# Patient Record
Sex: Female | Born: 1980 | Race: White | Hispanic: No | Marital: Married | Smoking: Never smoker
Health system: Southern US, Community
[De-identification: ages and names within clinical notes are randomized; demographics above are authoritative.]

## PROBLEM LIST (undated history)

## (undated) DIAGNOSIS — T8859XA Other complications of anesthesia, initial encounter: Secondary | ICD-10-CM

## (undated) DIAGNOSIS — K5903 Drug induced constipation: Secondary | ICD-10-CM

## (undated) DIAGNOSIS — E7800 Pure hypercholesterolemia, unspecified: Secondary | ICD-10-CM

## (undated) DIAGNOSIS — T4145XA Adverse effect of unspecified anesthetic, initial encounter: Secondary | ICD-10-CM

## (undated) DIAGNOSIS — F988 Other specified behavioral and emotional disorders with onset usually occurring in childhood and adolescence: Secondary | ICD-10-CM

## (undated) DIAGNOSIS — F419 Anxiety disorder, unspecified: Secondary | ICD-10-CM

## (undated) DIAGNOSIS — K219 Gastro-esophageal reflux disease without esophagitis: Secondary | ICD-10-CM

## (undated) HISTORY — DX: Other specified behavioral and emotional disorders with onset usually occurring in childhood and adolescence: F98.8

## (undated) HISTORY — DX: Pure hypercholesterolemia, unspecified: E78.00

## (undated) HISTORY — DX: Anxiety disorder, unspecified: F41.9

## (undated) HISTORY — DX: Gastro-esophageal reflux disease without esophagitis: K21.9

---
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
## 2004-02-10 HISTORY — PX: WISDOM TOOTH EXTRACTION: SHX21

## 2012-12-26 ENCOUNTER — Other Ambulatory Visit (HOSPITAL_COMMUNITY)
Admission: RE | Admit: 2012-12-26 | Discharge: 2012-12-26 | Disposition: A | Discharge status: Home / Self Care | Payer: 59 | Source: Ambulatory Visit | Attending: Physician Assistant | Admitting: Physician Assistant

## 2012-12-26 DIAGNOSIS — Z124 Encounter for screening for malignant neoplasm of cervix: Secondary | ICD-10-CM | POA: Insufficient documentation

## 2012-12-26 DIAGNOSIS — Z1151 Encounter for screening for human papillomavirus (HPV): Secondary | ICD-10-CM | POA: Insufficient documentation

## 2014-05-14 ENCOUNTER — Ambulatory Visit: Payer: BLUE CROSS/BLUE SHIELD | Attending: Physician Assistant

## 2014-05-14 DIAGNOSIS — M5442 Lumbago with sciatica, left side: Secondary | ICD-10-CM | POA: Diagnosis not present

## 2014-05-14 DIAGNOSIS — M25659 Stiffness of unspecified hip, not elsewhere classified: Secondary | ICD-10-CM

## 2014-05-14 NOTE — Patient Instructions (Signed)
Perform all exercises below:  Hold _20___ seconds. Repeat _3___ times.  Do __3__ sessions per day. CAUTION: Movement should be gentle, steady and slow.  Knee to Chest  Lying supine, bend involved knee to chest. Perform with each leg.  Copyright  VHI. All rights reserved.  Double Knee to Chest (Flexion)   Gently pull both knees toward chest. Feel stretch in lower back or buttock area. Breathing deeply, Lumbar Rotation: Caudal - Bilateral (Supine)  Feet and knees together, arms outstretched, rotate knees left, turning head in opposite direction, until stretch is felt.       

## 2014-05-14 NOTE — Therapy (Addendum)
[facility] [address] [place], [place], [address] Phone: [phone]   Fax:  [phone]  Physical Therapy Evaluation  Patient Details  Name: Katherine Atkinson MRN: [id number] Date of Birth: [birth year] Referring Provider:  Michel Harrow, PA-C  Encounter Date: 05/14/2014      PT End of Session - 05/14/14 1226    Visit Number 1   Date for PT Re-Evaluation 07/09/14   PT Start Time 1150   PT Stop Time 1237   PT Time Calculation (min) 47 min   Activity Tolerance Patient tolerated treatment well   Behavior During Therapy [facility] for tasks assessed/performed      No past medical history on file.  No past surgical history on file.  There were no vitals filed for this visit.  Visit Diagnosis:  Bilateral low back pain with left-sided sciatica - Plan: PT plan of care cert/re-cert  Hip stiffness, unspecified laterality - Plan: PT plan of care cert/re-cert      Subjective Assessment - 05/14/14 1153    Subjective Pt is a 34 y.o. who presents to PT with complaints of LBP and Lt LE pain.  Pt reports chronic history of LBP and onset of Lt LE pain in 2015.     Diagnostic tests none.  Pt has referral for x-ray   Patient Stated Goals reduce back and leg pain   Currently in Pain? Yes   Pain Score 5    Pain Location Back   Pain Orientation Right;Left   Pain Descriptors / Indicators Tightness;Shooting;Aching   Pain Radiating Towards Lt leg-posterior aspect to the foot.  Pt describes it as aching.   Pain Onset More than a month ago   Pain Frequency Intermittent   Aggravating Factors  rolling over in bed, getting up from a chair, sitting too long.  Not able to bend over.   Pain Relieving Factors Not moving, sitting down, laying down, stopping the aggravating activity   Multiple Pain Sites No            [facility] PT Assessment - 05/14/14 0001    Assessment   Medical Diagnosis low back pain (M54.5)   Onset Date 06/12/13   Next MD Visit none scheduled   Prior Therapy none   Precautions   Precautions None   Restrictions   Weight Bearing Restrictions Yes   Balance Screen   Has the patient fallen in the past 6 months No   Has the patient had a decrease in activity level because of a fear of falling?  No   Is the patient reluctant to leave their home because of a fear of falling?  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     AROM Assessment Site Lumbar   PROM   Overall PROM  Deficits  Overall PROM Comments hip flexion and IR limited by 25%.  No pain reported   PROM Assessment Site Hip   Strength   Overall Strength Within functional limits for tasks performed   Overall Strength Comments 5/5 bilateral LE strength   Strength Assessment Site Hip;Knee   Palpation   Palpation Pt with muscle tension over bilateral lumbar paraspinals, SI joint and deep gluteals.     Special Tests    Special Tests Lumbar   Slump test   Findings Positive   Side Left   Straight Leg Raise   Findings Negative   Side  Left   Bed Mobility   Bed Mobility Rolling Right;Rolling Left   Rolling Right 6: Modified independent (Device/Increase time)   Rolling Left 6: Modified independent (Device/Increase time)                   [facility] Adult PT Treatment/Exercise - 05/14/14 0001    Exercises    Exercises Lumbar;Knee/Hip   Lumbar Exercises: Stretches   Single Knee to Chest Stretch 3 reps;20 seconds   Double Knee to Chest Stretch 3 reps;20 seconds   Lower Trunk Rotation 3 reps;20 seconds   Modalities   Modalities Moist Heat   Moist Heat Therapy   Number Minutes Moist Heat 15 Minutes   Moist Heat Location Other (comment)  lumbar                PT Education - 05/14/14 1222    Education provided Yes   Education Details HEP: low trunk rotation, knee and double knee to chest   Person(s) Educated Patient   Methods Explanation;Demonstration;Handout   Comprehension Verbalized understanding;Returned demonstration          PT Short Term Goals - 05/14/14 1223    PT SHORT TERM GOAL #1   Title be independent in initial HEP   Time 4   Period Weeks   Status New   PT SHORT TERM GOAL #2   Title report a 30% reduction in LBP and Lt LE pain with ADLs and houswork   Time 4   Period Weeks   Status New           PT Long Term Goals - 05/14/14 1151    PT LONG TERM GOAL #1   Title be independent in an advanced HEP   Time 8   Period Weeks   Status New   PT LONG TERM GOAL #2   Title reduce FOTO to < or = to 36% limitation   Time 8   Period Weeks   Status New   PT LONG TERM GOAL #3   Title report a 70% reduction in LBP and Lt LE pain with ADLs and housework   Time 8   Period Weeks   Status New   PT LONG TERM GOAL #4   Title roll in bed with < or = to 3/10 LBP   Time 8   Period Weeks   Status New               Plan - 05/14/14 1227    Pt will benefit from skilled therapeutic intervention in order to improve on the following deficits Impaired flexibility;Improper body mechanics;Pain   Rehab Potential Good   PT Frequency 2x / week   PT Duration 8 weeks   PT Treatment/Interventions ADLs/Self Care Home Management;Moist Heat;Therapeutic activities;Therapeutic exercise;Passive range of motion;Ultrasound;Manual techniques;Neuromuscular re-education;Electrical  Stimulation   PT Next Visit Plan Hip and lumbar flexibility, body mechanics education  Consulted and Agree with Plan of Care Patient         Problem List There are no active problems to display for this patient.   TAKACS,KELLY, PT 05/14/2014, 12:34 PM PHYSICAL THERAPY DISCHARGE SUMMARY  Visits from Start of Care: 1  Current functional level related to goals / functional outcomes: Pt attended 1 PT session for evaluation and didn't return.     Remaining deficits: Unknown as pt didn't return to PT .  Thank you for this referral.     Education / Equipment: HEP Plan: Patient agrees to discharge.  Patient goals were not met. Patient is being discharged due to the patient's request.  ?????   Sigurd Sos, PT 07/02/2014 9:26 AM  [facility] [address], [place], [place], [address] Phone: [phone]   Fax:  [phone]

## 2014-09-05 ENCOUNTER — Other Ambulatory Visit: Payer: Self-pay | Admitting: Family Medicine

## 2014-09-05 DIAGNOSIS — M545 Low back pain: Secondary | ICD-10-CM

## 2014-09-13 ENCOUNTER — Ambulatory Visit
Admission: RE | Admit: 2014-09-13 | Discharge: 2014-09-13 | Disposition: A | Discharge status: Home / Self Care | Payer: BLUE CROSS/BLUE SHIELD | Source: Ambulatory Visit | Attending: Family Medicine | Admitting: Family Medicine

## 2014-09-13 DIAGNOSIS — M545 Low back pain: Secondary | ICD-10-CM

## 2014-11-10 HISTORY — PX: BACK SURGERY: SHX140

## 2015-01-02 ENCOUNTER — Other Ambulatory Visit: Payer: Self-pay | Admitting: Neurological Surgery

## 2015-01-25 ENCOUNTER — Encounter (HOSPITAL_COMMUNITY)
Admission: RE | Admit: 2015-01-25 | Discharge: 2015-01-25 | Disposition: A | Discharge status: Home / Self Care | Payer: BLUE CROSS/BLUE SHIELD | Source: Ambulatory Visit | Attending: Neurological Surgery | Admitting: Neurological Surgery

## 2015-01-25 ENCOUNTER — Encounter (HOSPITAL_COMMUNITY): Payer: Self-pay

## 2015-01-25 ENCOUNTER — Other Ambulatory Visit (HOSPITAL_COMMUNITY): Payer: Self-pay | Admitting: Neurological Surgery

## 2015-01-25 DIAGNOSIS — M5126 Other intervertebral disc displacement, lumbar region: Secondary | ICD-10-CM | POA: Diagnosis not present

## 2015-01-25 DIAGNOSIS — Z01812 Encounter for preprocedural laboratory examination: Secondary | ICD-10-CM | POA: Insufficient documentation

## 2015-01-25 DIAGNOSIS — Z0183 Encounter for blood typing: Secondary | ICD-10-CM | POA: Diagnosis not present

## 2015-01-25 HISTORY — DX: Other complications of anesthesia, initial encounter: T88.59XA

## 2015-01-25 HISTORY — DX: Adverse effect of unspecified anesthetic, initial encounter: T41.45XA

## 2015-01-25 HISTORY — DX: Drug induced constipation: K59.03

## 2015-01-25 LAB — TYPE AND SCREEN
ABO/RH(D): O NEG
Antibody Screen: NEGATIVE

## 2015-01-25 LAB — CBC
HCT: 41.8 % (ref 36.0–46.0)
Hemoglobin: 14.1 g/dL (ref 12.0–15.0)
MCH: 30.1 pg (ref 26.0–34.0)
MCHC: 33.7 g/dL (ref 30.0–36.0)
MCV: 89.3 fL (ref 78.0–100.0)
PLATELETS: 334 10*3/uL (ref 150–400)
RBC: 4.68 MIL/uL (ref 3.87–5.11)
RDW: 13.2 % (ref 11.5–15.5)
WBC: 9.4 10*3/uL (ref 4.0–10.5)

## 2015-01-25 LAB — HCG, SERUM, QUALITATIVE: Preg, Serum: NEGATIVE

## 2015-01-25 LAB — SURGICAL PCR SCREEN
MRSA, PCR: NEGATIVE
Staphylococcus aureus: POSITIVE — AB

## 2015-01-25 LAB — ABO/RH: ABO/RH(D): O NEG

## 2015-01-25 NOTE — Pre-Procedure Instructions (Signed)
Katherine Atkinson  01/25/2015      [id number]- [place], [place] - [address] [place] [id number] Phone: [phone] Fax: [phone]    Your procedure is scheduled on 01/31/15.  [facility] at 700 A.M.  Call this number if you have problems the morning of surgery:  [phone]   Remember:  Do not eat food or drink liquids after midnight.  Take these medicines the morning of surgery with A SIP OF WATER pain med if needed(tylenol)     STOP all herbel meds, nsaids (aleve,naproxen,advil,ibuprofen) 5 days prior to surgery including vitamins,aspirin   Do not wear jewelry, make-up or nail polish.  Do not wear lotions, powders, or perfumes.  You may wear deodorant.  Do not shave 48 hours prior to surgery.  Men may shave face and neck.  Do not bring valuables to the hospital.  [facility] is not responsible for any belongings or valuables.  Contacts, dentures or bridgework may not be worn into surgery.  Leave your suitcase in the car.  After surgery it may be brought to your room.  For patients admitted to the hospital, discharge time will be determined by your treatment team.  Patients discharged the day of surgery will not be allowed to drive home.   Name and phone number of your driver:    Special instructions:  Special Instructions: [facility] - Preparing for Surgery  Before surgery, you can play an important role.  Because skin is not sterile, your skin needs to be as free of germs as possible.  You can reduce the number of germs on you skin by washing with CHG (chlorahexidine gluconate) soap before surgery.  CHG is an antiseptic cleaner which kills germs and bonds with the skin to continue killing germs even after washing.  Please DO NOT use if you have an allergy to CHG or antibacterial soaps.  If your skin becomes reddened/irritated stop using the CHG and inform your nurse when you arrive at Short Stay.  Do not  shave (including legs and underarms) for at least 48 hours prior to the first CHG shower.  You may shave your face.  Please follow these instructions carefully:   1.  Shower with CHG Soap the night before surgery and the morning of Surgery.  2.  If you choose to wash your hair, wash your hair first as usual with your normal shampoo.  3.  After you shampoo, rinse your hair and body thoroughly to remove the Shampoo.  4.  Use CHG as you would any other liquid soap.  You can apply chg directly  to the skin and wash gently with scrungie or a clean washcloth.  5.  Apply the CHG Soap to your body ONLY FROM THE NECK DOWN.  Do not use on open wounds or open sores.  Avoid contact with your eyes ears, mouth and genitals (private parts).  Wash genitals (private parts)       with your normal soap.  6.  Wash thoroughly, paying special attention to the area where your surgery will be performed.  7.  Thoroughly rinse your body with warm water from the neck down.  8.  DO NOT shower/wash with your normal soap after using and rinsing off the CHG Soap.  9.  Pat yourself dry with a clean towel.            10.  Wear clean pajamas.  11.  Place clean sheets on your bed the night of your first shower and do not sleep with pets.  Day of Surgery  Do not apply any lotions/deodorants the morning of surgery.  Please wear clean clothes to the hospital/surgery center.  Please read over the following fact sheets that you were given. Pain Booklet, Coughing and Deep Breathing, Blood Transfusion Information, MRSA Information and Surgical Site Infection Prevention

## 2015-01-25 NOTE — Pre-Procedure Instructions (Signed)
   Katherine Atkinson  01/25/2015     Your procedure is scheduled on : Thursday January 31, 2015 at 9:00 AM.  [facility] at 7:00 AM.  Call this number if you have problems the morning of surgery: [phone]    Remember:  Do not eat food or drink liquids after midnight.  Take these medicines the morning of surgery with A SIP OF WATER: Oxycodone (Percocet) if needed, Diazepam (Valium) if needed     STOP any vitamins, herbal meds, NSAIDS (aleve,naproxen,advil,ibuprofen), etc   Do not wear jewelry, make-up or nail polish.  Do not wear lotions, powders, or perfumes.    Do not shave 48 hours prior to surgery.    Do not bring valuables to the hospital.  [facility] is not responsible for any belongings or valuables.  Contacts, dentures or bridgework may not be worn into surgery.  Leave your suitcase in the car.  After surgery it may be brought to your room.  For patients admitted to the hospital, discharge time will be determined by your treatment team.  Patients discharged the day of surgery will not be allowed to drive home.   Name and phone number of your driver:    Special instructions: Shower using CHG soap the night before and the morning of your surgery  Please read over the following fact sheets that you were given. Pain Booklet, Coughing and Deep Breathing, Blood Transfusion Information, MRSA Information and Surgical Site Infection Prevention

## 2015-01-30 MED ORDER — CEFAZOLIN SODIUM-DEXTROSE 2-3 GM-% IV SOLR
2.0000 g | INTRAVENOUS | Status: AC
  Administered 2015-01-31: 2 g via INTRAVENOUS
  Filled 2015-01-30: qty 50

## 2015-01-31 ENCOUNTER — Encounter (HOSPITAL_COMMUNITY): Payer: Self-pay | Admitting: *Deleted

## 2015-01-31 ENCOUNTER — Inpatient Hospital Stay (HOSPITAL_COMMUNITY): Payer: BLUE CROSS/BLUE SHIELD | Admitting: Anesthesiology

## 2015-01-31 ENCOUNTER — Inpatient Hospital Stay (HOSPITAL_COMMUNITY): Payer: BLUE CROSS/BLUE SHIELD

## 2015-01-31 ENCOUNTER — Inpatient Hospital Stay (HOSPITAL_COMMUNITY)
Admission: RE | Admit: 2015-01-31 | Discharge: 2015-02-01 | DRG: 857 | Disposition: A | Discharge status: Home Health | Payer: BLUE CROSS/BLUE SHIELD | Source: Ambulatory Visit | Attending: Neurological Surgery | Admitting: Neurological Surgery

## 2015-01-31 ENCOUNTER — Encounter (HOSPITAL_COMMUNITY)
Admission: RE | Disposition: A | Discharge status: Home Health | Payer: Self-pay | Source: Ambulatory Visit | Attending: Neurological Surgery

## 2015-01-31 DIAGNOSIS — T814XXA Infection following a procedure, initial encounter: Principal | ICD-10-CM | POA: Diagnosis present

## 2015-01-31 DIAGNOSIS — L02212 Cutaneous abscess of back [any part, except buttock]: Secondary | ICD-10-CM | POA: Diagnosis present

## 2015-01-31 DIAGNOSIS — M5126 Other intervertebral disc displacement, lumbar region: Secondary | ICD-10-CM | POA: Diagnosis present

## 2015-01-31 DIAGNOSIS — Y838 Other surgical procedures as the cause of abnormal reaction of the patient, or of later complication, without mention of misadventure at the time of the procedure: Secondary | ICD-10-CM | POA: Diagnosis present

## 2015-01-31 DIAGNOSIS — M7989 Other specified soft tissue disorders: Secondary | ICD-10-CM | POA: Diagnosis present

## 2015-01-31 DIAGNOSIS — IMO0002 Reserved for concepts with insufficient information to code with codable children: Secondary | ICD-10-CM | POA: Diagnosis present

## 2015-01-31 DIAGNOSIS — M549 Dorsalgia, unspecified: Secondary | ICD-10-CM

## 2015-01-31 HISTORY — PX: TRANSFORAMINAL LUMBAR INTERBODY FUSION (TLIF) WITH PEDICLE SCREW FIXATION 1 LEVEL: SHX6141

## 2015-01-31 HISTORY — PX: LUMBAR WOUND DEBRIDEMENT: SHX1988

## 2015-01-31 LAB — SEDIMENTATION RATE: Sed Rate: 13 mm/hr (ref 0–22)

## 2015-01-31 LAB — C-REACTIVE PROTEIN: CRP: <0.5 mg/dL (ref <1.0)

## 2015-01-31 SURGERY — TRANSFORAMINAL LUMBAR INTERBODY FUSION (TLIF) WITH PEDICLE SCREW FIXATION 1 LEVEL
Anesthesia: General | Site: Back

## 2015-01-31 MED ORDER — SENNA 8.6 MG PO TABS
1.0000 | ORAL_TABLET | Freq: Two times a day (BID) | ORAL | Status: DC
  Administered 2015-01-31 – 2015-02-01 (×3): 8.6 mg via ORAL
  Filled 2015-01-31 (×3): qty 1

## 2015-01-31 MED ORDER — SUCCINYLCHOLINE CHLORIDE 20 MG/ML IJ SOLN
INTRAMUSCULAR | Status: DC | PRN
  Administered 2015-01-31: 80 mg via INTRAVENOUS

## 2015-01-31 MED ORDER — FENTANYL CITRATE (PF) 100 MCG/2ML IJ SOLN
INTRAMUSCULAR | Status: DC | PRN
  Administered 2015-01-31: 250 ug via INTRAVENOUS
  Administered 2015-01-31: 100 ug via INTRAVENOUS

## 2015-01-31 MED ORDER — FENTANYL CITRATE (PF) 250 MCG/5ML IJ SOLN
INTRAMUSCULAR | Status: AC
  Filled 2015-01-31: qty 5

## 2015-01-31 MED ORDER — SODIUM CHLORIDE 0.9 % IJ SOLN: 3.0000 mL | INTRAMUSCULAR | Status: DC | PRN

## 2015-01-31 MED ORDER — MIDAZOLAM HCL 2 MG/2ML IJ SOLN
INTRAMUSCULAR | Status: AC
  Filled 2015-01-31: qty 2

## 2015-01-31 MED ORDER — DEXTROSE 5 % IV SOLN
2.0000 g | INTRAVENOUS | Status: DC
  Administered 2015-01-31 – 2015-02-01 (×2): 2 g via INTRAVENOUS
  Filled 2015-01-31 (×2): qty 2

## 2015-01-31 MED ORDER — HYDROMORPHONE HCL 1 MG/ML IJ SOLN
0.2500 mg | INTRAMUSCULAR | Status: DC | PRN
  Administered 2015-01-31 (×2): 0.5 mg via INTRAVENOUS

## 2015-01-31 MED ORDER — PROPOFOL 1000 MG/100ML IV EMUL
INTRAVENOUS | Status: AC
  Administered 2015-01-31: 50 ug/kg/min via INTRAVENOUS
  Filled 2015-01-31: qty 100

## 2015-01-31 MED ORDER — OXYCODONE-ACETAMINOPHEN 5-325 MG PO TABS
1.0000 | ORAL_TABLET | ORAL | Status: DC | PRN
  Administered 2015-01-31 (×3): 2 via ORAL
  Administered 2015-02-01: 1 via ORAL
  Administered 2015-02-01: 2 via ORAL
  Filled 2015-01-31 (×4): qty 2

## 2015-01-31 MED ORDER — HYDROMORPHONE HCL 1 MG/ML IJ SOLN: 0.2500 mg | INTRAMUSCULAR | Status: DC | PRN

## 2015-01-31 MED ORDER — ONDANSETRON HCL 4 MG/2ML IJ SOLN
INTRAMUSCULAR | Status: AC
  Filled 2015-01-31: qty 2

## 2015-01-31 MED ORDER — METHOCARBAMOL 500 MG PO TABS
ORAL_TABLET | ORAL | Status: AC
  Filled 2015-01-31: qty 1

## 2015-01-31 MED ORDER — ONDANSETRON HCL 4 MG/2ML IJ SOLN: 4.0000 mg | INTRAMUSCULAR | Status: DC | PRN

## 2015-01-31 MED ORDER — OXYCODONE HCL 5 MG/5ML PO SOLN: 5.0000 mg | Freq: Once | ORAL | Status: DC | PRN

## 2015-01-31 MED ORDER — METHOCARBAMOL 500 MG PO TABS
500.0000 mg | ORAL_TABLET | Freq: Four times a day (QID) | ORAL | Status: DC | PRN
  Administered 2015-01-31: 500 mg via ORAL

## 2015-01-31 MED ORDER — SODIUM CHLORIDE 0.9 % IJ SOLN
3.0000 mL | Freq: Two times a day (BID) | INTRAMUSCULAR | Status: DC
  Administered 2015-01-31: 3 mL via INTRAVENOUS

## 2015-01-31 MED ORDER — PHENYLEPHRINE HCL 10 MG/ML IJ SOLN
10.0000 mg | INTRAVENOUS | Status: DC | PRN
  Administered 2015-01-31: 50 ug/min via INTRAVENOUS

## 2015-01-31 MED ORDER — MIDAZOLAM HCL 5 MG/5ML IJ SOLN
INTRAMUSCULAR | Status: DC | PRN
  Administered 2015-01-31 (×2): 2 mg via INTRAVENOUS

## 2015-01-31 MED ORDER — DIAZEPAM 5 MG/ML IJ SOLN
2.5000 mg | Freq: Once | INTRAMUSCULAR | Status: AC
  Administered 2015-01-31: 2.5 mg via INTRAVENOUS

## 2015-01-31 MED ORDER — PROPOFOL 10 MG/ML IV BOLUS
INTRAVENOUS | Status: DC | PRN
  Administered 2015-01-31: 200 mg via INTRAVENOUS
  Administered 2015-01-31: 100 mg via INTRAVENOUS

## 2015-01-31 MED ORDER — SODIUM CHLORIDE 0.9 % IV SOLN: 250.0000 mL | INTRAVENOUS | Status: DC

## 2015-01-31 MED ORDER — THROMBIN 5000 UNITS EX SOLR
OROMUCOSAL | Status: DC | PRN
  Administered 2015-01-31: 09:00:00 via TOPICAL

## 2015-01-31 MED ORDER — BUPIVACAINE HCL (PF) 0.25 % IJ SOLN
INTRAMUSCULAR | Status: DC | PRN
  Administered 2015-01-31: 8 mL

## 2015-01-31 MED ORDER — HYDROMORPHONE HCL 1 MG/ML IJ SOLN
INTRAMUSCULAR | Status: AC
  Filled 2015-01-31: qty 1

## 2015-01-31 MED ORDER — GADOBENATE DIMEGLUMINE 529 MG/ML IV SOLN
18.0000 mL | Freq: Once | INTRAVENOUS | Status: AC | PRN
  Administered 2015-01-31: 18 mL via INTRAVENOUS

## 2015-01-31 MED ORDER — ROCURONIUM BROMIDE 50 MG/5ML IV SOLN
INTRAVENOUS | Status: AC
  Filled 2015-01-31: qty 1

## 2015-01-31 MED ORDER — ACETAMINOPHEN 160 MG/5ML PO SOLN
325.0000 mg | ORAL | Status: DC | PRN
  Filled 2015-01-31: qty 20.3

## 2015-01-31 MED ORDER — DIAZEPAM 5 MG/ML IJ SOLN
INTRAMUSCULAR | Status: AC
  Administered 2015-01-31: 17:00:00
  Filled 2015-01-31: qty 2

## 2015-01-31 MED ORDER — ACETAMINOPHEN 10 MG/ML IV SOLN
INTRAVENOUS | Status: AC
  Administered 2015-01-31: 1000 mg via INTRAVENOUS
  Filled 2015-01-31: qty 100

## 2015-01-31 MED ORDER — 0.9 % SODIUM CHLORIDE (POUR BTL) OPTIME
TOPICAL | Status: DC | PRN
  Administered 2015-01-31: 1000 mL

## 2015-01-31 MED ORDER — ACETAMINOPHEN 650 MG RE SUPP: 650.0000 mg | RECTAL | Status: DC | PRN

## 2015-01-31 MED ORDER — MENTHOL 3 MG MT LOZG: 1.0000 | LOZENGE | OROMUCOSAL | Status: DC | PRN

## 2015-01-31 MED ORDER — DEXAMETHASONE SODIUM PHOSPHATE 4 MG/ML IJ SOLN
INTRAMUSCULAR | Status: DC | PRN
  Administered 2015-01-31: 4 mg via INTRAVENOUS

## 2015-01-31 MED ORDER — ALBUMIN HUMAN 5 % IV SOLN
INTRAVENOUS | Status: DC | PRN
  Administered 2015-01-31 (×2): via INTRAVENOUS

## 2015-01-31 MED ORDER — ACETAMINOPHEN 325 MG PO TABS: 325.0000 mg | ORAL_TABLET | ORAL | Status: DC | PRN

## 2015-01-31 MED ORDER — OXYCODONE-ACETAMINOPHEN 5-325 MG PO TABS
ORAL_TABLET | ORAL | Status: AC
  Filled 2015-01-31: qty 2

## 2015-01-31 MED ORDER — OXYCODONE HCL 5 MG PO TABS: 5.0000 mg | ORAL_TABLET | Freq: Once | ORAL | Status: DC | PRN

## 2015-01-31 MED ORDER — ARTIFICIAL TEARS OP OINT
TOPICAL_OINTMENT | OPHTHALMIC | Status: AC
  Filled 2015-01-31: qty 3.5

## 2015-01-31 MED ORDER — VANCOMYCIN HCL 1000 MG IV SOLR
INTRAVENOUS | Status: DC | PRN
  Administered 2015-01-31: 1000 mg

## 2015-01-31 MED ORDER — POTASSIUM CHLORIDE IN NACL 20-0.9 MEQ/L-% IV SOLN
INTRAVENOUS | Status: DC
  Administered 2015-01-31 – 2015-02-01 (×3): via INTRAVENOUS
  Filled 2015-01-31 (×4): qty 1000

## 2015-01-31 MED ORDER — SODIUM CHLORIDE 0.9 % IR SOLN
Status: DC | PRN
  Administered 2015-01-31: 08:00:00

## 2015-01-31 MED ORDER — VANCOMYCIN HCL 10 G IV SOLR
1250.0000 mg | Freq: Two times a day (BID) | INTRAVENOUS | Status: DC
  Administered 2015-02-01: 1250 mg via INTRAVENOUS
  Filled 2015-01-31 (×3): qty 1250

## 2015-01-31 MED ORDER — PROPOFOL 10 MG/ML IV BOLUS
INTRAVENOUS | Status: AC
  Filled 2015-01-31: qty 40

## 2015-01-31 MED ORDER — FENTANYL CITRATE (PF) 250 MCG/5ML IJ SOLN
INTRAMUSCULAR | Status: AC
Start: 2015-01-31 — End: 2015-01-31
  Filled 2015-01-31: qty 5

## 2015-01-31 MED ORDER — THROMBIN 20000 UNITS EX SOLR
CUTANEOUS | Status: DC | PRN
  Administered 2015-01-31: 09:00:00 via TOPICAL

## 2015-01-31 MED ORDER — VANCOMYCIN HCL 1000 MG IV SOLR
INTRAVENOUS | Status: AC
  Filled 2015-01-31: qty 1000

## 2015-01-31 MED ORDER — METHOCARBAMOL 1000 MG/10ML IJ SOLN: 500.0000 mg | Freq: Four times a day (QID) | INTRAMUSCULAR | Status: DC | PRN

## 2015-01-31 MED ORDER — MORPHINE SULFATE (PF) 2 MG/ML IV SOLN: 1.0000 mg | INTRAVENOUS | Status: DC | PRN

## 2015-01-31 MED ORDER — ONDANSETRON HCL 4 MG/2ML IJ SOLN
INTRAMUSCULAR | Status: DC | PRN
  Administered 2015-01-31: 4 mg via INTRAVENOUS

## 2015-01-31 MED ORDER — LACTATED RINGERS IV SOLN
INTRAVENOUS | Status: DC
  Administered 2015-01-31: 07:00:00 via INTRAVENOUS

## 2015-01-31 MED ORDER — PHENOL 1.4 % MT LIQD: 1.0000 | OROMUCOSAL | Status: DC | PRN

## 2015-01-31 MED ORDER — ACETAMINOPHEN 325 MG PO TABS: 650.0000 mg | ORAL_TABLET | ORAL | Status: DC | PRN

## 2015-01-31 MED ORDER — VANCOMYCIN HCL 10 G IV SOLR
1250.0000 mg | Freq: Two times a day (BID) | INTRAVENOUS | Status: DC
  Administered 2015-01-31: 1250 mg via INTRAVENOUS
  Filled 2015-01-31 (×2): qty 1250

## 2015-01-31 MED ORDER — LIDOCAINE HCL (CARDIAC) 20 MG/ML IV SOLN
INTRAVENOUS | Status: DC | PRN
  Administered 2015-01-31: 80 mg via INTRAVENOUS

## 2015-01-31 SURGICAL SUPPLY — 48 items
BAG DECANTER FOR FLEXI CONT (MISCELLANEOUS) ×3 IMPLANT
BENZOIN TINCTURE PRP APPL 2/3 (GAUZE/BANDAGES/DRESSINGS) ×3 IMPLANT
BLADE CLIPPER SURG (BLADE) IMPLANT
BUR MATCHSTICK NEURO 3.0 LAGG (BURR) ×3 IMPLANT
CANISTER SUCT 3000ML PPV (MISCELLANEOUS) ×3 IMPLANT
CLIP NEUROVISION LG (CLIP) ×3 IMPLANT
CONT SPEC 4OZ CLIKSEAL STRL BL (MISCELLANEOUS) ×3 IMPLANT
COVER BACK TABLE 60X90IN (DRAPES) ×3 IMPLANT
DRAPE C-ARM 42X72 X-RAY (DRAPES) ×6 IMPLANT
DRAPE LAPAROTOMY 100X72X124 (DRAPES) ×3 IMPLANT
DRAPE POUCH INSTRU U-SHP 10X18 (DRAPES) ×3 IMPLANT
DRAPE SURG 17X23 STRL (DRAPES) ×3 IMPLANT
DRSG OPSITE POSTOP 4X6 (GAUZE/BANDAGES/DRESSINGS) ×3 IMPLANT
DURAPREP 26ML APPLICATOR (WOUND CARE) ×3 IMPLANT
ELECT REM PT RETURN 9FT ADLT (ELECTROSURGICAL) ×3
ELECTRODE REM PT RTRN 9FT ADLT (ELECTROSURGICAL) ×2 IMPLANT
EVACUATOR 1/8 PVC DRAIN (DRAIN) IMPLANT
GAUZE SPONGE 4X4 16PLY XRAY LF (GAUZE/BANDAGES/DRESSINGS) IMPLANT
GLOVE BIO SURGEON STRL SZ8 (GLOVE) ×6 IMPLANT
GLOVE INDICATOR 7.5 STRL GRN (GLOVE) ×6 IMPLANT
GOWN STRL REUS W/ TWL LRG LVL3 (GOWN DISPOSABLE) IMPLANT
GOWN STRL REUS W/ TWL XL LVL3 (GOWN DISPOSABLE) ×4 IMPLANT
GOWN STRL REUS W/TWL 2XL LVL3 (GOWN DISPOSABLE) IMPLANT
GOWN STRL REUS W/TWL LRG LVL3 (GOWN DISPOSABLE)
GOWN STRL REUS W/TWL XL LVL3 (GOWN DISPOSABLE) ×2
HEMOSTAT POWDER KIT SURGIFOAM (HEMOSTASIS) ×3 IMPLANT
KIT BASIN OR (CUSTOM PROCEDURE TRAY) ×3 IMPLANT
KIT ROOM TURNOVER OR (KITS) ×3 IMPLANT
MILL MEDIUM DISP (BLADE) IMPLANT
MODULE NVM5 NEXT GEN EMG (NEEDLE) ×3 IMPLANT
NEEDLE HYPO 25X1 1.5 SAFETY (NEEDLE) ×3 IMPLANT
NS IRRIG 1000ML POUR BTL (IV SOLUTION) ×3 IMPLANT
PACK LAMINECTOMY NEURO (CUSTOM PROCEDURE TRAY) ×3 IMPLANT
PAD ARMBOARD 7.5X6 YLW CONV (MISCELLANEOUS) ×9 IMPLANT
SPONGE LAP 4X18 X RAY DECT (DISPOSABLE) IMPLANT
SPONGE SURGIFOAM ABS GEL 100 (HEMOSTASIS) ×3 IMPLANT
STRIP CLOSURE SKIN 1/2X4 (GAUZE/BANDAGES/DRESSINGS) ×3 IMPLANT
SUT VIC AB 0 CT1 18XCR BRD8 (SUTURE) ×2 IMPLANT
SUT VIC AB 0 CT1 8-18 (SUTURE) ×1
SUT VIC AB 2-0 CP2 18 (SUTURE) ×3 IMPLANT
SUT VIC AB 3-0 SH 8-18 (SUTURE) ×6 IMPLANT
SWAB CULTURE LIQ STUART DBL (MISCELLANEOUS) ×3 IMPLANT
TOWEL OR 17X24 6PK STRL BLUE (TOWEL DISPOSABLE) ×3 IMPLANT
TOWEL OR 17X26 10 PK STRL BLUE (TOWEL DISPOSABLE) ×3 IMPLANT
TRAP SPECIMEN MUCOUS 40CC (MISCELLANEOUS) IMPLANT
TRAY FOLEY W/METER SILVER 14FR (SET/KITS/TRAYS/PACK) ×3 IMPLANT
TUBE ANAEROBIC SPECIMEN COL (MISCELLANEOUS) ×3 IMPLANT
WATER STERILE IRR 1000ML POUR (IV SOLUTION) ×3 IMPLANT

## 2015-01-31 NOTE — Progress Notes (Signed)
ANTIBIOTIC CONSULT NOTE - INITIAL  Pharmacy Consult for Rocephin / Vancomycin Indication: lumbar wound  No Known Allergies  Vital Signs: Temp: 98.1 F (36.7 C) (12/22 1030) Temp Source: Oral (12/22 0639) BP: 137/94 mmHg (12/22 1145) Pulse Rate: 88 (12/22 1145) Intake/Output from previous day:   Intake/Output from this shift: Total I/O In: 1770 [P.O.:120; I.V.:1000; Other:400; IV Piggyback:250] Out: -   Labs: No results for input(s): WBC, HGB, PLT, LABCREA, CREATININE in the last 72 hours. CrCl cannot be calculated (Patient has no serum creatinine result on file.). No results for input(s): VANCOTROUGH, VANCOPEAK, VANCORANDOM, GENTTROUGH, GENTPEAK, GENTRANDOM, TOBRATROUGH, TOBRAPEAK, TOBRARND, AMIKACINPEAK, AMIKACINTROU, AMIKACIN in the last 72 hours.   Microbiology: Recent Results (from the past 720 hour(s))  Surgical pcr screen     Status: Abnormal   Collection Time: 01/25/15 11:06 AM  Result Value Ref Range Status   MRSA, PCR NEGATIVE NEGATIVE Final   Staphylococcus aureus POSITIVE (A) NEGATIVE Final    Comment:        The Xpert SA Assay (FDA approved for NASAL specimens in patients over 34 years of age), is one component of a comprehensive surveillance program.  Test performance has been validated by [facility] for patients greater than or equal to 34 year old. It is not intended to diagnose infection nor to guide or monitor treatment.   Body fluid culture     Status: None (Preliminary result)   Collection Time: 01/31/15  9:45 AM  Result Value Ref Range Status   Specimen Description FLUID BACK  Final   Special Requests LUMBAR  Final   Gram Stain   Final    RARE WBC PRESENT, PREDOMINANTLY PMN NO ORGANISMS SEEN    Culture PENDING  Incomplete   Report Status PENDING  Incomplete  Body fluid culture     Status: None (Preliminary result)   Collection Time: 01/31/15  9:45 AM  Result Value Ref Range Status   Specimen Description FLUID BACK  Final   Special  Requests LUMBAR  Final   Gram Stain   Final    RARE WBC PRESENT, PREDOMINANTLY PMN NO ORGANISMS SEEN    Culture PENDING  Incomplete   Report Status PENDING  Incomplete    Medical History: Past Medical History  Diagnosis Date  . Complication of anesthesia     Had hot and cold sweats after having anesthesia  . Constipation due to pain medication     Assessment: 34 year old female to begin Vancomycin and Rocephin s/p lumbar wound I + D Scr stable  Goal of Therapy:  Vancomycin trough level 10-15 mcg/ml  Plan:  Rocephin 2 grams iv Q 24 hours Vancomycin 1250 mg iv Q 12 hours Follow up Scr, cultures, fever trend, plan  Thank you Okey RegalLisa Brondon Wann, PharmD [phone]  01/31/2015,12:38 PM

## 2015-01-31 NOTE — H&P (Signed)
Subjective: Patient is a 34 y.o. female admitted for TLIF. Onset of symptoms was several weeks ago, rapidly worsening since that time.  The pain is rated intense, and is located at the across the lower back and radiates to legs. She is status post microdiscectomy and has a large recurrent disc herniation. The pain is described as aching and occurs all day. The symptoms have been progressive. Symptoms are exacerbated by exercise. MRI or CT showed large recurrent disc herniation with kyphosis L4-5   Past Medical History  Diagnosis Date  . Complication of anesthesia     Had hot and cold sweats after having anesthesia  . Constipation due to pain medication     Past Surgical History  Procedure Laterality Date  . Back surgery  October 2016  . Dilation and curettage of uterus  2000  . Wisdom tooth extraction  2006    Prior to Admission medications   Medication Sig Start Date End Date Taking? Authorizing Provider  diazepam (VALIUM) 5 MG tablet Take 5 mg by mouth every 6 (six) hours as needed for anxiety.   Yes Historical Provider, MD  naproxen sodium (ANAPROX) 220 MG tablet Take 220 mg by mouth 2 (two) times daily with a meal.   Yes Historical Provider, MD  oxyCODONE-acetaminophen (PERCOCET/ROXICET) 5-325 MG tablet Take 2 tablets by mouth every 4 (four) hours as needed for moderate pain or severe pain.   Yes Historical Provider, MD  medroxyPROGESTERone (DEPO-PROVERA) 150 MG/ML injection Inject 150 mg into the muscle every 3 (three) months.    Historical Provider, MD  naproxen (NAPROSYN) 500 MG tablet Take 500 mg by mouth 2 (two) times daily with a meal.    Historical Provider, MD   No Known Allergies  Social History  Substance Use Topics  . Smoking status: Never Smoker   . Smokeless tobacco: Never Used  . Alcohol Use: Yes     Comment: social    History reviewed. No pertinent family history.   Review of Systems  Positive ROS: neg  All other systems have been reviewed and were otherwise  negative with the exception of those mentioned in the HPI and as above.  Objective: Vital signs in last 24 hours: Temp:  [97.8 F (36.6 C)] 97.8 F (36.6 C) (12/22 0639) Pulse Rate:  [99] 99 (12/22 0639) Resp:  [20] 20 (12/22 0639) BP: (123)/(63) 123/63 mmHg (12/22 0640) SpO2:  [97 %] 97 % (12/22 0639) Weight:  [92.08 kg (203 lb)] 92.08 kg (203 lb) (12/22 [id number])  General Appearance: Alert, cooperative, no distress, appears stated age Head: Normocephalic, without obvious abnormality, atraumatic Eyes: PERRL, conjunctiva/corneas clear, EOM's intact    Neck: Supple, symmetrical, trachea midline Back: Symmetric, no curvature, ROM normal, no CVA tenderness Lungs:  respirations unlabored Heart: Regular rate and rhythm Abdomen: Soft, non-tender Extremities: Extremities normal, atraumatic, no cyanosis or edema Pulses: 2+ and symmetric all extremities Skin: Skin color, texture, turgor normal, no rashes or lesions  NEUROLOGIC:   Mental status: Alert and oriented x4,  no aphasia, good attention span, fund of knowledge, and memory Motor Exam - grossly normal Sensory Exam - grossly normal Reflexes: 1+ Coordination - grossly normal Gait - grossly normal Balance - grossly normal Cranial Nerves: I: smell Not tested  II: visual acuity  OS: nl    OD: nl  II: visual fields Full to confrontation  II: pupils Equal, round, reactive to light  III,VII: ptosis None  III,IV,VI: extraocular muscles  Full ROM  V: mastication Normal  V:  facial light touch sensation  Normal  V,VII: corneal reflex  Present  VII: facial muscle function - upper  Normal  VII: facial muscle function - lower Normal  VIII: hearing Not tested  IX: soft palate elevation  Normal  IX,X: gag reflex Present  XI: trapezius strength  5/5  XI: sternocleidomastoid strength 5/5  XI: neck flexion strength  5/5  XII: tongue strength  Normal    Data Review Lab Results  Component Value Date   WBC 9.4 01/25/2015   HGB 14.1  01/25/2015   HCT 41.8 01/25/2015   MCV 89.3 01/25/2015   PLT 334 01/25/2015   No results found for: NA, K, CL, CO2, BUN, CREATININE, GLUCOSE No results found for: INR, PROTIME  Assessment/Plan: Patient admitted for TLIF L4-5. Patient has failed a reasonable attempt at conservative therapy.  I explained the condition and procedure to the patient and answered any questions.  Patient wishes to proceed with procedure as planned. Understands risks/ benefits and typical outcomes of procedure.   Karilyn Wind S 01/31/2015 8:15 AM

## 2015-01-31 NOTE — Progress Notes (Signed)
Pt admitted to 5C12 at this time from PACU.  Alert and oriented.  Denies pain at present.  Medicated in PACU prior to transfer to floor.  Honeycomb dressing to back, CDI.  Pt oriented to room; call bell within reach and bed alarm set.  Pt verbalizes understanding of calling staff before attempting to get out of bed.  Husband at bedside.  Will continue to monitor and assess.

## 2015-01-31 NOTE — Progress Notes (Signed)
Sacral dressing removed due to nature of patient's surgery.

## 2015-01-31 NOTE — Op Note (Signed)
01/31/2015  10:45 AM  PATIENT:  Katherine Atkinson  34 y.o. 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 She presented to do through except surgery with severe back pain without leg pain. MRI with and without contrast showed a large midline recurrent disc herniation at L4-5. She tried medical management without relief. I recommended a transforaminal lumbar interbody fusion at L4-5. Patient understood the risks, benefits, and alternatives and potential outcomes and wished to proceed.  Findings at surgery: The patient has noted a hard knot under the incision for the last couple of days. It was nontender and she has had no fever. Her pain is actually been somewhat better the last couple of days. After incision as I was dissecting down through the subcutaneous fat I encountered what appeared to be an abscess. Release of exudate under pressure. I washed this out and debrided clean the tissues. Irrigated with saline solution containing bacitracin. I cultured the exudate. I did open the fascia to assure that there was no subfascial infection. I found nothing. The tissues looked very healthy and clean. Not knowing whether this could be infection or something noninfectious like fat necrosis, I felt it was best to cancel the remainder of the surgery because of the risk of discitis and deep wound space infection should this represent abscess and bacteria.  She received preoperative antibiotics and therefore  the cultures and Gram stain may come back negative. I'm going to treat this like infection with at least 4 weeks of IV antibiotics followed by oral antibiotics. If this was infection in her pain improves with treatment that she may not need further surgery in the form of interbody fusion. If this was not infection or if this turns out to not be the cause of her pain, and she will need delayed interbody fusion L4-5 should her pain continue. At That point I would consider an anterior approach.  All of this has been discussed with the patient and her husband in detail  PROCEDURE DETAILS: The patient was taken to the operating room and after induction of adequate general endotracheal seizure she was rolled into the prone position on chest rolls and all pressure points were padded. Her lumbar region was cleaned and then prepped with DuraPrep and then draped in usual sterile fashion. Her old incision was opened. As I dissected down through the subcutaneous fat down an inch deep encountered release of what appeared to be exudate. It was under pressure. I cultured this exudate and then washed everything out. I debrided the tissues. Tissues were healthy-appearing. I found the fascia and opened the old fascial opening to make sure there was no subfascial infection. I found none. I was not sure whether this represented abscess or fat necrosis but I felt that this represented infection then it would be best to cancel the remainder surgery in order to not produce bacteria into the deep wound space such as the disc. Therefore wound was irrigated once again and then lined with vancomycin powder. An close the fascia and subcutaneous  tissues with 0 Vicryl. I closed the subcutaneous tissues with 2-0 Vicryl also. Closed the subcuticular tissue with 3-0 Vicryl. The skin was closed with benzoin and Steri-Strips. Sterile dressing was applied and the patient was awakened from general anesthesia and transferred to the recovery room in  stable condition. When she was awake and coherent I discussed the situation with her after I had artery discussed the situation with her husband in the waiting room.  PLAN OF CARE: Admit to inpatient   PATIENT DISPOSITION:  PACU - hemodynamically stable.   Delay start of Pharmacological VTE agent (>24hrs) due to surgical blood loss or risk of bleeding:  yes

## 2015-01-31 NOTE — Anesthesia Preprocedure Evaluation (Signed)
Anesthesia Evaluation  Patient identified by MRN, date of birth, ID band Patient awake    Reviewed: Allergy & Precautions, NPO status , Patient's Chart, lab work & pertinent test results  History of Anesthesia Complications Negative for: history of anesthetic complications  Airway Mallampati: II  TM Distance: >3 FB Neck ROM: Full    Dental  (+) Teeth Intact   Pulmonary neg pulmonary ROS,    breath sounds clear to auscultation       Cardiovascular negative cardio ROS   Rhythm:Regular     Neuro/Psych Anxiety  Neuromuscular disease    GI/Hepatic negative GI ROS, Neg liver ROS,   Endo/Other  Morbid obesity  Renal/GU negative Renal ROS     Musculoskeletal   Abdominal   Peds  Hematology negative hematology ROS (+)   Anesthesia Other Findings   Reproductive/Obstetrics                             Anesthesia Physical Anesthesia Plan  ASA: II  Anesthesia Plan: General   Post-op Pain Management:    Induction: Intravenous  Airway Management Planned: Oral ETT  Additional Equipment: None  Intra-op Plan:   Post-operative Plan: Extubation in OR  Informed Consent: I have reviewed the patients History and Physical, chart, labs and discussed the procedure including the risks, benefits and alternatives for the proposed anesthesia with the patient or authorized representative who has indicated his/her understanding and acceptance.   Dental advisory given  Plan Discussed with: CRNA and Surgeon  Anesthesia Plan Comments:         Anesthesia Quick Evaluation

## 2015-01-31 NOTE — Transfer of Care (Signed)
Immediate Anesthesia Transfer of Care Note  Patient: Luther RedoDanielle Hreha  Procedure(s) Performed: Procedure(s) with comments: L4-5 Transforaminal lumbar interbody fusion (N/A) - L4-5 Transforaminal lumbar interbody  Patient Location: PACU  Anesthesia Type:General  Level of Consciousness: awake, alert  and oriented  Airway & Oxygen Therapy: Patient Spontanous Breathing and Patient connected to nasal cannula oxygen  Post-op Assessment: Report given to RN, Post -op Vital signs reviewed and stable and Patient moving all extremities X 4  Post vital signs: Reviewed and stable  Last Vitals:  Filed Vitals:   01/31/15 0639 01/31/15 0640  BP:  123/63  Pulse: 99   Temp: 36.6 C   Resp: 20     Complications: No apparent anesthesia complications

## 2015-01-31 NOTE — Anesthesia Procedure Notes (Signed)
Procedure Name: Intubation Date/Time: 01/31/2015 9:08 AM Performed by: Margaree MackintoshYACOUB, Chestine Belknap B Pre-anesthesia Checklist: Patient identified, Emergency Drugs available, Suction available, Patient being monitored and Timeout performed Patient Re-evaluated:Patient Re-evaluated prior to inductionOxygen Delivery Method: Circle system utilized Preoxygenation: Pre-oxygenation with 100% oxygen Intubation Type: IV induction Ventilation: Mask ventilation without difficulty Laryngoscope Size: Mac and 4 Grade View: Grade I Tube type: Oral Tube size: 7.0 mm Number of attempts: 1 Airway Equipment and Method: Stylet Placement Confirmation: ETT inserted through vocal cords under direct vision,  positive ETCO2 and breath sounds checked- equal and bilateral Secured at: 22 cm Tube secured with: Tape Dental Injury: Teeth and Oropharynx as per pre-operative assessment

## 2015-02-01 ENCOUNTER — Encounter (HOSPITAL_COMMUNITY): Payer: Self-pay | Admitting: Neurological Surgery

## 2015-02-01 LAB — BASIC METABOLIC PANEL
ANION GAP: 10 (ref 5–15)
BUN: 6 mg/dL (ref 6–20)
CALCIUM: 9.3 mg/dL (ref 8.9–10.3)
CO2: 20 mmol/L — ABNORMAL LOW (ref 22–32)
Chloride: 112 mmol/L — ABNORMAL HIGH (ref 101–111)
Creatinine, Ser: 0.69 mg/dL (ref 0.44–1.00)
GFR calc Af Amer: >60 mL/min (ref >60)
GLUCOSE: 145 mg/dL — AB (ref 65–99)
Potassium: 4 mmol/L (ref 3.5–5.1)
SODIUM: 142 mmol/L (ref 135–145)

## 2015-02-01 MED ORDER — OXYCODONE-ACETAMINOPHEN 5-325 MG PO TABS: 2.0000 | ORAL_TABLET | ORAL | Status: DC | PRN

## 2015-02-01 MED ORDER — SODIUM CHLORIDE 0.9 % IJ SOLN: 10.0000 mL | INTRAMUSCULAR | Status: DC | PRN

## 2015-02-01 NOTE — Care Management Note (Addendum)
Case Management Note  Patient Details  Name: Katherine Atkinson MRN: [id number] Date of Birth: [birth year]  Subjective/Objective:                    Action/Plan: Patient admitted for lumbar surgery and found to have infection of lumbar area. Patient is from home with her husband. Plan is to discharge patient home today with IV antibiotics. CM met with the patient and her husband and provided them a list of Home health agencies in the [facility] area. They selected [facility]. Pam with IV therapy at [facility] notified and accepted the referral. Patient also ordered rolling walker and 3 in 1. Jermaine with [facility] notified and will deliver the equipment to the room. Patient's PCP not listed. She states her primary MD group is [employer]. Bedside RN updated.   Expected Discharge Date:                  Expected Discharge Plan:  [place]  In-House Referral:     Discharge planning Services  CM Consult  Post Acute Care Choice:  Durable Medical Equipment, Home Health Choice offered to:     DME Arranged:  3-N-1, Walker rolling DME Agency:  [place]:  RN [facility] Agency:  [place]  Status of Service:     Medicare Important Message Given:    Date Medicare IM Given:    Medicare IM give by:    Date Additional Medicare IM Given:    Additional Medicare Important Message give by:     If discussed at [place] of Stay Meetings, dates discussed:    Additional Comments:  Pollie Friar, RN 02/01/2015, 10:19 AM

## 2015-02-01 NOTE — Evaluation (Signed)
Physical Therapy Evaluation and Discharge Patient Details Name: Katherine RedoDanielle Atkinson MRN: [id number] DOB: 05/07/1980 Today's Date: 02/01/2015   History of Present Illness  Pt is a 34 y/o female who presents for TLIF. Once in OR, abscess was found and TLIF was postponed. Pt underwent an I&D instead.   Clinical Impression  Patient evaluated by Physical Therapy with no further acute PT needs identified. All education has been completed and the patient has no further questions. At the time of PT eval pt was able to perform transfers and ambulation with supervision to modified independence with use of RW. Pt was educated in maintaining back precautions for comfort at this time, and handout was provided. See below for any follow-up Physical Therapy or equipment needs. PT is signing off. Thank you for this referral.    Follow Up Recommendations No PT follow up    Equipment Recommendations  Rolling walker with 5" wheels;3in1 (PT)    Recommendations for Other Services       Precautions / Restrictions Precautions Precautions: Fall;Back Precaution Booklet Issued: Yes (comment) Precaution Comments: Back precautions for comfort Required Braces or Orthoses: Spinal Brace Spinal Brace: Lumbar corset;Applied in sitting position (Pt had previously and wearing for comfort) Restrictions Weight Bearing Restrictions: No      Mobility  Bed Mobility Overal bed mobility: Modified Independent             General bed mobility comments: VC's for log roll technique for comfort.   Transfers Overall transfer level: Needs assistance Equipment used: Rolling walker (2 wheeled) Transfers: Sit to/from Stand Sit to Stand: Supervision;Modified independent (Device/Increase time)         General transfer comment: Supervision for safety when not using the walker. Mod I with the RW.   Ambulation/Gait Ambulation/Gait assistance: Modified independent (Device/Increase time) Ambulation Distance (Feet): 250  Feet Assistive device: Rolling walker (2 wheeled) Gait Pattern/deviations: Step-through pattern;Decreased stride length;Trunk flexed Gait velocity: Decreased Gait velocity interpretation: Below normal speed for age/gender General Gait Details: Pt was able to ambulate well with RW for support. VC's for improved posture and sequencing with a roller walker vs the standard walker she is used to at home.   Stairs Stairs: Yes Stairs assistance: Min guard Stair Management: One rail Left;Forwards;Step to pattern Number of Stairs: 4 General stair comments: Pt's husband was instructed in assisting pt up the stairs. Pt held to wall on the L side to simulate home environment, and husband provided HHA on the R.   Wheelchair Mobility    Modified Rankin (Stroke Patients Only)       Balance Overall balance assessment: No apparent balance deficits (not formally assessed)                                           Pertinent Vitals/Pain Pain Assessment: 0-10 Pain Score: 1  Pain Location: Back Pain Descriptors / Indicators: Operative site guarding;Discomfort Pain Intervention(s): Limited activity within patient's tolerance;Monitored during session;Repositioned    Home Living Family/patient expects to be discharged to:: Private residence Living Arrangements: Spouse/significant other Available Help at Discharge: Family;Available 24 hours/day Type of Home: House Home Access: Stairs to enter Entrance Stairs-Rails: None Entrance Stairs-Number of Steps: 4 Home Layout: One level Home Equipment: Grab bars - tub/shower;Walker - standard      Prior Function Level of Independence: Needs assistance   Gait / Transfers Assistance Needed: Uses walker most of the time.  ADL's / Homemaking Assistance Needed: Husband was assisting with showering. Occasionally assists with dressing as well.         Hand Dominance        Extremity/Trunk Assessment   Upper Extremity Assessment:  Defer to OT evaluation           Lower Extremity Assessment: Generalized weakness      Cervical / Trunk Assessment: Normal  Communication   Communication: No difficulties  Cognition Arousal/Alertness: Awake/alert Behavior During Therapy: WFL for tasks assessed/performed Overall Cognitive Status: Within Functional Limits for tasks assessed                      General Comments      Exercises        Assessment/Plan    PT Assessment Patent does not need any further PT services  PT Diagnosis Difficulty walking;Acute pain   PT Problem List    PT Treatment Interventions     PT Goals (Current goals can be found in the Care Plan section) Acute Rehab PT Goals Patient Stated Goal: Home today PT Goal Formulation: All assessment and education complete, DC therapy    Frequency     Barriers to discharge        Co-evaluation               End of Session Equipment Utilized During Treatment: Back brace Activity Tolerance: Patient tolerated treatment well Patient left: in bed;with call bell/phone within reach;with family/visitor present Nurse Communication: Mobility status         Time: 1610-9604 PT Time Calculation (min) (ACUTE ONLY): 46 min   Charges:   PT Evaluation $Initial PT Evaluation Tier I: 1 Procedure PT Treatments $Gait Training: 23-37 mins   PT G Codes:        Conni Slipper 02-02-2015, 11:31 AM  Conni Slipper, PT, DPT Acute Rehabilitation Services Pager: [phone]

## 2015-02-01 NOTE — Progress Notes (Signed)
Peripherally Inserted Central Catheter/Midline Placement  The IV Nurse has discussed with the patient and/or persons authorized to consent for the patient, the purpose of this procedure and the potential benefits and risks involved with this procedure.  The benefits include less needle sticks, lab draws from the catheter and patient may be discharged home with the catheter.  Risks include, but not limited to, infection, bleeding, blood clot (thrombus formation), and puncture of an artery; nerve damage and irregular heat beat.  Alternatives to this procedure were also discussed.  PICC/Midline Placement Documentation        Katherine Atkinson, Katherine Atkinson 02/01/2015, 7:38 AM Consent obtained by Reginia FortsMarilyn Lumban, RN

## 2015-02-01 NOTE — Discharge Summary (Signed)
Physician Discharge Summary  Patient ID: Katherine Atkinson MRN: [id number] DOB/AGE: 34-Dec-1982 34 y.o.  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 We had planned to perform a transforaminal lumbar interbody fusion but upon incision found release of a fluid that looked like exudate. This was cultured and the Gram stain was negative and cultures were negative at the time of discharge. We did start her on antibiotics empirically. Her sedimentation rate and CRP were normal. Repeat MRI showed enhancement of the disc space and retraction of the recurrent disc herniation. This could've been consistent with either discitis or a sterile postoperative disc degeneration. Given her normal CRP and sedimentation rate and young healthy age it is likely that this is a sterile process. However given our findings at surgery I felt it was safest to treat her like an infection despite her normal sedimentation rate and CRP and normal Gram stain. The patient tolerated the procedure well and was taken to the recovery room and then to the floor in stable condition. The hospital course was routine. There were no complications. The wound remained clean dry and intact. Pt had appropriate back soreness. No complaints of leg pain or new N/T/W. The patient remained afebrile with stable vital signs, and tolerated a regular diet. The patient continued to increase activities, and pain was well controlled with oral pain medications. A PICC line was placed and she was discharged with plans for 4 weeks to 6 weeks of IV vancomycin and Rocephin  Consults: None  Significant Diagnostic Studies:  Results for  orders placed or performed during the hospital encounter of 01/31/15  Body fluid culture  Result Value Ref Range   Specimen Description FLUID BACK    Special Requests LUMBAR    Gram Stain      RARE WBC PRESENT, PREDOMINANTLY PMN NO ORGANISMS SEEN    Culture NO GROWTH < 24 HOURS    Report Status PENDING   Anaerobic culture  Result Value Ref Range   Specimen Description FLUID BACK    Special Requests LUMBAR    Gram Stain      RARE WBC PRESENT, PREDOMINANTLY PMN NO ORGANISMS SEEN    Culture      NO ANAEROBES ISOLATED; CULTURE IN PROGRESS FOR 5 DAYS   Report Status PENDING   Body fluid culture  Result Value Ref Range   Specimen Description FLUID BACK    Special Requests LUMBAR    Gram Stain      RARE WBC PRESENT, PREDOMINANTLY PMN NO ORGANISMS SEEN    Culture NO GROWTH < 24 HOURS    Report Status PENDING   Anaerobic culture  Result Value Ref Range   Specimen Description FLUID BACK    Special Requests LUMBAR    Gram Stain      RARE WBC PRESENT, PREDOMINANTLY PMN NO ORGANISMS SEEN    Culture PENDING    Report Status PENDING   Sedimentation rate  Result Value Ref Range   Sed Rate 13 0 - 22 mm/hr  C-reactive protein  Result Value Ref Range   CRP <0.5 <1.0 mg/dL  Basic metabolic panel  Result Value Ref Range   Sodium 142 135 - 145 mmol/L   Potassium 4.0 3.5 - 5.1 mmol/L  Chloride 112 (H) 101 - 111 mmol/L   CO2 20 (L) 22 - 32 mmol/L   Glucose, Bld 145 (H) 65 - 99 mg/dL   BUN 6 6 - 20 mg/dL   Creatinine, Ser 0.45 0.44 - 1.00 mg/dL   Calcium 9.3 8.9 - 40.9 mg/dL   GFR calc non Af Amer >60 >60 mL/min   GFR calc Af Amer >60 >60 mL/min   Anion gap 10 5 - 15    Mr Lumbar Spine W Wo Contrast  01/31/2015  CLINICAL DATA:  Rapid worsening of back pain. Previous discectomy L4-5. Date of surgery 11/30/2014. Surgery begun again today but stopped after concern of possible infection. Irrigation and debridement of lumbar wound. EXAM: MRI LUMBAR SPINE WITHOUT AND WITH CONTRAST  TECHNIQUE: Multiplanar and multiecho pulse sequences of the lumbar spine were obtained without and with intravenous contrast. CONTRAST:  18 cc MultiHance COMPARISON:  12/28/2014.  09/13/2014. FINDINGS: Again there is no abnormality at L2-3 or above. There L3-4 disc bulges centrally but there is no compressive stenosis. At L4-5, fluid previously seen in the posterior subcutaneous tissues as either resort door was removed at surgery today. There has been further loss of disc space height. Material bulging posteriorly from the disc space previously felt to represent recurrent disc herniation has involuted and may simply have been hemorrhage or fluid. There is annular bulging today that indents the thecal sac in causes mild stenosis of both lateral recesses. There is edema and enhancement affecting the L4 and L5 vertebral bodies which is progressive since the previous study. There is edema and enhancement of the paravertebral soft tissues without evidence of a nonenhancing component. There is a small amount of fluid at the right hemilaminectomy site, no more than 1 cm in diameter. I think these findings are indeterminate for discitis osteomyelitis versus postoperative evolutionary change. Certainly, the findings at we are seeing could be due to sterile changes after diskectomy. Alternatively, developing discitis osteomyelitis could have this appearance. There is no evidence of epidural abscess. L5-S1 shows mild bulging of the disc and mild facet degeneration without stenosis. IMPRESSION: At L4-5, there is less fluid within the posterior subcutaneous tissues. This may have resolved or have been evacuated at surgery today. Progressive loss of disc space. Involution of material previously seen protruding backward from the disc space, with a less mass effect upon the thecal sac. There is still some mass effect upon the thecal sac with some narrowing of the lateral recesses. Edema and enhancement within the L4 and L5  vertebral bodies which is progressive. Some enhancing tissue along the margins of the disc anteriorly and laterally, but no nonenhancing components. No evidence of epidural collection. I think these findings are indeterminate for discitis osteomyelitis versus possible sterile post discectomy evolutionary changes. Electronically Signed   By: Paulina Fusi M.D.   On: 01/31/2015 18:03   Dg C-arm 1-60 Min-no Report  01/31/2015  CLINICAL DATA: mas plif no report C-ARM 1-60 MINUTES Fluoroscopy was utilized by the requesting physician.  No radiographic interpretation.    Antibiotics:  Anti-infectives    Start     Dose/Rate Route Frequency Ordered Stop   02/01/15 0600  vancomycin (VANCOCIN) 1,250 mg in sodium chloride 0.9 % 250 mL IVPB     1,250 mg 166.7 mL/hr over 90 Minutes Intravenous Every 12 hours 01/31/15 1824     01/31/15 1400  vancomycin (VANCOCIN) 1,250 mg in sodium chloride 0.9 % 250 mL IVPB  Status:  Discontinued     1,250  mg 166.7 mL/hr over 90 Minutes Intravenous Every 12 hours 01/31/15 1242 01/31/15 1824   01/31/15 1400  cefTRIAXone (ROCEPHIN) 2 g in dextrose 5 % 50 mL IVPB     2 g 100 mL/hr over 30 Minutes Intravenous Every 24 hours 01/31/15 1242     01/31/15 0956  vancomycin (VANCOCIN) powder  Status:  Discontinued       As needed 01/31/15 1055 01/31/15 1055   01/31/15 0949  vancomycin (VANCOCIN) 1000 MG powder    Comments:  Karmen Stabs   : cabinet override      01/31/15 0949 01/31/15 2159   01/31/15 0830  ceFAZolin (ANCEF) IVPB 2 g/50 mL premix     2 g 100 mL/hr over 30 Minutes Intravenous To [facility] 01/30/15 1100 01/31/15 0915   01/31/15 0820  bacitracin 50,000 Units in sodium chloride irrigation 0.9 % 500 mL irrigation  Status:  Discontinued       As needed 01/31/15 1050 01/31/15 1055      Discharge Exam: Blood pressure 121/65, pulse 97, temperature 98.4 F (36.9 C), temperature source Oral, resp. rate 20, height  (1.676 m), weight 92.08 kg (203 lb),  SpO2 98 %. Neurologic: Grossly normal Dressing dry  Discharge Medications:     Medication List    TAKE these medications        diazepam 5 MG tablet  Commonly known as:  VALIUM  Take 5 mg by mouth every 6 (six) hours as needed for anxiety.     medroxyPROGESTERone 150 MG/ML injection  Commonly known as:  DEPO-PROVERA  Inject 150 mg into the muscle every 3 (three) months.     naproxen 500 MG tablet  Commonly known as:  NAPROSYN  Take 500 mg by mouth 2 (two) times daily with a meal.     naproxen sodium 220 MG tablet  Commonly known as:  ANAPROX  Take 220 mg by mouth 2 (two) times daily with a meal.     oxyCODONE-acetaminophen 5-325 MG tablet  Commonly known as:  PERCOCET/ROXICET  Take 2 tablets by mouth every 4 (four) hours as needed for moderate pain or severe pain.        Disposition: Home   Final Dx: Subcutaneous abscess versus fat necrosis      Discharge Instructions     Remove dressing in 72 hours    Complete by:  As directed      Call MD for:  difficulty breathing, headache or visual disturbances    Complete by:  As directed      Call MD for:  persistant nausea and vomiting    Complete by:  As directed      Call MD for:  redness, tenderness, or signs of infection (pain, swelling, redness, odor or green/yellow discharge around incision site)    Complete by:  As directed      Call MD for:  severe uncontrolled pain    Complete by:  As directed      Call MD for:  temperature >100.4    Complete by:  As directed      Diet - low sodium heart healthy    Complete by:  As directed      Increase activity slowly    Complete by:  As directed            Follow-up Information    Follow up with Muriah Harsha S, MD. Schedule an appointment as soon as possible for a visit in 2 weeks.   Specialty:  Neurosurgery   Contact information:   [address] [place] [address] [phone]        Signed: Tia AlertJONES,Gerome Kokesh S 02/01/2015, 3:24 PM

## 2015-02-03 LAB — BODY FLUID CULTURE
CULTURE: NO GROWTH
Culture: NO GROWTH

## 2015-02-03 NOTE — Anesthesia Postprocedure Evaluation (Signed)
Anesthesia Post Note  Patient: Katherine RedoDanielle Atkinson  Procedure(s) Performed: Procedure(s) (LRB): cancelled after start due to possible soft tissue infection (N/A) IRRIGATION AND DEBRIDEMENT OF LUMBAR WOUND  Patient location during evaluation: PACU Anesthesia Type: General Level of consciousness: awake Pain management: pain level controlled Vital Signs Assessment: post-procedure vital signs reviewed and stable Respiratory status: spontaneous breathing Cardiovascular status: stable Postop Assessment: no signs of nausea or vomiting Anesthetic complications: no    Last Vitals:  Filed Vitals:   02/01/15 1042 02/01/15 1447  BP: 128/77 121/65  Pulse: 91 97  Temp: 36.6 C 36.9 C  Resp: 18 20    Last Pain:  Filed Vitals:   02/01/15 1448  PainSc: Asleep                 Nyeshia Mysliwiec

## 2015-02-05 LAB — ANAEROBIC CULTURE

## 2016-04-16 ENCOUNTER — Other Ambulatory Visit (HOSPITAL_COMMUNITY)
Admission: RE | Admit: 2016-04-16 | Discharge: 2016-04-16 | Disposition: A | Discharge status: Home / Self Care | Payer: BLUE CROSS/BLUE SHIELD | Source: Ambulatory Visit | Attending: Physician Assistant | Admitting: Physician Assistant

## 2016-04-16 ENCOUNTER — Other Ambulatory Visit: Payer: Self-pay | Admitting: Physician Assistant

## 2016-04-16 DIAGNOSIS — Z113 Encounter for screening for infections with a predominantly sexual mode of transmission: Secondary | ICD-10-CM | POA: Diagnosis not present

## 2016-04-16 DIAGNOSIS — Z1151 Encounter for screening for human papillomavirus (HPV): Secondary | ICD-10-CM | POA: Diagnosis not present

## 2016-04-16 DIAGNOSIS — Z0000 Encounter for general adult medical examination without abnormal findings: Secondary | ICD-10-CM | POA: Diagnosis not present

## 2016-04-16 DIAGNOSIS — Z124 Encounter for screening for malignant neoplasm of cervix: Secondary | ICD-10-CM | POA: Diagnosis not present

## 2016-04-17 LAB — CYTOLOGY - PAP
BACTERIAL VAGINITIS: POSITIVE — AB
CANDIDA VAGINITIS: NEGATIVE
Diagnosis: NEGATIVE
HPV: NOT DETECTED

## 2016-10-19 DIAGNOSIS — Z23 Encounter for immunization: Secondary | ICD-10-CM | POA: Diagnosis not present

## 2017-05-12 DIAGNOSIS — L853 Xerosis cutis: Secondary | ICD-10-CM | POA: Diagnosis not present

## 2017-05-12 DIAGNOSIS — D2221 Melanocytic nevi of right ear and external auricular canal: Secondary | ICD-10-CM | POA: Diagnosis not present

## 2017-05-12 DIAGNOSIS — D485 Neoplasm of uncertain behavior of skin: Secondary | ICD-10-CM | POA: Diagnosis not present

## 2017-05-12 DIAGNOSIS — L218 Other seborrheic dermatitis: Secondary | ICD-10-CM | POA: Diagnosis not present

## 2017-05-12 DIAGNOSIS — N926 Irregular menstruation, unspecified: Secondary | ICD-10-CM | POA: Diagnosis not present

## 2017-05-12 DIAGNOSIS — Z1283 Encounter for screening for malignant neoplasm of skin: Secondary | ICD-10-CM | POA: Diagnosis not present

## 2017-05-12 DIAGNOSIS — M545 Low back pain: Secondary | ICD-10-CM | POA: Diagnosis not present

## 2017-05-12 DIAGNOSIS — Z0000 Encounter for general adult medical examination without abnormal findings: Secondary | ICD-10-CM | POA: Diagnosis not present

## 2017-07-07 DIAGNOSIS — S92912A Unspecified fracture of left toe(s), initial encounter for closed fracture: Secondary | ICD-10-CM | POA: Diagnosis not present

## 2017-07-13 DIAGNOSIS — S92515A Nondisplaced fracture of proximal phalanx of left lesser toe(s), initial encounter for closed fracture: Secondary | ICD-10-CM | POA: Diagnosis not present

## 2017-07-13 DIAGNOSIS — M79672 Pain in left foot: Secondary | ICD-10-CM | POA: Diagnosis not present

## 2018-02-18 DIAGNOSIS — F419 Anxiety disorder, unspecified: Secondary | ICD-10-CM | POA: Diagnosis not present

## 2018-03-22 DIAGNOSIS — F419 Anxiety disorder, unspecified: Secondary | ICD-10-CM | POA: Diagnosis not present

## 2018-05-05 DIAGNOSIS — F419 Anxiety disorder, unspecified: Secondary | ICD-10-CM | POA: Diagnosis not present

## 2018-11-07 DIAGNOSIS — F419 Anxiety disorder, unspecified: Secondary | ICD-10-CM | POA: Diagnosis not present

## 2019-10-03 DIAGNOSIS — S92511A Displaced fracture of proximal phalanx of right lesser toe(s), initial encounter for closed fracture: Secondary | ICD-10-CM | POA: Diagnosis not present

## 2019-10-04 DIAGNOSIS — S92502A Displaced unspecified fracture of left lesser toe(s), initial encounter for closed fracture: Secondary | ICD-10-CM | POA: Diagnosis not present

## 2019-10-18 DIAGNOSIS — S92502A Displaced unspecified fracture of left lesser toe(s), initial encounter for closed fracture: Secondary | ICD-10-CM | POA: Diagnosis not present

## 2019-10-24 DIAGNOSIS — F419 Anxiety disorder, unspecified: Secondary | ICD-10-CM | POA: Diagnosis not present

## 2019-11-15 DIAGNOSIS — S92502A Displaced unspecified fracture of left lesser toe(s), initial encounter for closed fracture: Secondary | ICD-10-CM | POA: Diagnosis not present

## 2019-11-16 DIAGNOSIS — Z23 Encounter for immunization: Secondary | ICD-10-CM | POA: Diagnosis not present

## 2019-12-07 DIAGNOSIS — R0789 Other chest pain: Secondary | ICD-10-CM | POA: Diagnosis not present

## 2019-12-20 ENCOUNTER — Other Ambulatory Visit: Payer: Self-pay | Admitting: Physician Assistant

## 2019-12-20 DIAGNOSIS — N644 Mastodynia: Secondary | ICD-10-CM

## 2020-01-17 ENCOUNTER — Ambulatory Visit: Payer: Self-pay

## 2020-01-17 ENCOUNTER — Other Ambulatory Visit: Payer: Self-pay

## 2020-01-17 ENCOUNTER — Ambulatory Visit
Admission: RE | Admit: 2020-01-17 | Discharge: 2020-01-17 | Disposition: A | Discharge status: Home / Self Care | Payer: BC Managed Care – PPO | Source: Ambulatory Visit | Attending: Physician Assistant | Admitting: Physician Assistant

## 2020-01-17 DIAGNOSIS — N644 Mastodynia: Secondary | ICD-10-CM | POA: Diagnosis not present

## 2020-02-15 DIAGNOSIS — Z0000 Encounter for general adult medical examination without abnormal findings: Secondary | ICD-10-CM | POA: Diagnosis not present

## 2020-02-15 DIAGNOSIS — Z23 Encounter for immunization: Secondary | ICD-10-CM | POA: Diagnosis not present

## 2020-02-15 DIAGNOSIS — Z13220 Encounter for screening for lipoid disorders: Secondary | ICD-10-CM | POA: Diagnosis not present

## 2020-09-10 DIAGNOSIS — D485 Neoplasm of uncertain behavior of skin: Secondary | ICD-10-CM | POA: Diagnosis not present

## 2020-09-10 DIAGNOSIS — D2262 Melanocytic nevi of left upper limb, including shoulder: Secondary | ICD-10-CM | POA: Diagnosis not present

## 2020-09-10 DIAGNOSIS — D225 Melanocytic nevi of trunk: Secondary | ICD-10-CM | POA: Diagnosis not present

## 2020-09-13 DIAGNOSIS — M7711 Lateral epicondylitis, right elbow: Secondary | ICD-10-CM | POA: Diagnosis not present

## 2020-10-02 DIAGNOSIS — D485 Neoplasm of uncertain behavior of skin: Secondary | ICD-10-CM | POA: Diagnosis not present

## 2020-10-02 DIAGNOSIS — L988 Other specified disorders of the skin and subcutaneous tissue: Secondary | ICD-10-CM | POA: Diagnosis not present

## 2020-10-09 DIAGNOSIS — Z309 Encounter for contraceptive management, unspecified: Secondary | ICD-10-CM | POA: Diagnosis not present

## 2020-10-09 DIAGNOSIS — N750 Cyst of Bartholin's gland: Secondary | ICD-10-CM | POA: Diagnosis not present

## 2020-10-09 DIAGNOSIS — Z6841 Body Mass Index (BMI) 40.0 and over, adult: Secondary | ICD-10-CM | POA: Diagnosis not present

## 2020-10-09 DIAGNOSIS — Z01419 Encounter for gynecological examination (general) (routine) without abnormal findings: Secondary | ICD-10-CM | POA: Diagnosis not present

## 2020-10-30 DIAGNOSIS — N751 Abscess of Bartholin's gland: Secondary | ICD-10-CM | POA: Diagnosis not present

## 2020-10-30 DIAGNOSIS — N750 Cyst of Bartholin's gland: Secondary | ICD-10-CM | POA: Diagnosis not present

## 2021-01-17 DIAGNOSIS — M7711 Lateral epicondylitis, right elbow: Secondary | ICD-10-CM | POA: Diagnosis not present

## 2021-01-22 DIAGNOSIS — D485 Neoplasm of uncertain behavior of skin: Secondary | ICD-10-CM | POA: Diagnosis not present

## 2021-01-22 DIAGNOSIS — Z1283 Encounter for screening for malignant neoplasm of skin: Secondary | ICD-10-CM | POA: Diagnosis not present

## 2021-01-22 DIAGNOSIS — D2261 Melanocytic nevi of right upper limb, including shoulder: Secondary | ICD-10-CM | POA: Diagnosis not present

## 2021-01-22 DIAGNOSIS — D225 Melanocytic nevi of trunk: Secondary | ICD-10-CM | POA: Diagnosis not present

## 2021-02-18 DIAGNOSIS — Z0000 Encounter for general adult medical examination without abnormal findings: Secondary | ICD-10-CM | POA: Diagnosis not present

## 2021-02-18 DIAGNOSIS — E7800 Pure hypercholesterolemia, unspecified: Secondary | ICD-10-CM | POA: Diagnosis not present

## 2021-02-18 DIAGNOSIS — Z23 Encounter for immunization: Secondary | ICD-10-CM | POA: Diagnosis not present

## 2021-02-28 DIAGNOSIS — M7711 Lateral epicondylitis, right elbow: Secondary | ICD-10-CM | POA: Diagnosis not present

## 2021-03-03 ENCOUNTER — Other Ambulatory Visit: Payer: Self-pay | Admitting: Family Medicine

## 2021-03-03 DIAGNOSIS — Z1231 Encounter for screening mammogram for malignant neoplasm of breast: Secondary | ICD-10-CM

## 2021-03-19 ENCOUNTER — Ambulatory Visit: Payer: BC Managed Care – PPO

## 2021-04-01 ENCOUNTER — Ambulatory Visit
Admission: RE | Admit: 2021-04-01 | Discharge: 2021-04-01 | Disposition: A | Discharge status: Home / Self Care | Payer: BC Managed Care – PPO | Source: Ambulatory Visit | Attending: Family Medicine | Admitting: Family Medicine

## 2021-04-01 ENCOUNTER — Other Ambulatory Visit: Payer: Self-pay

## 2021-04-01 DIAGNOSIS — Z1231 Encounter for screening mammogram for malignant neoplasm of breast: Secondary | ICD-10-CM

## 2021-06-06 DIAGNOSIS — E7800 Pure hypercholesterolemia, unspecified: Secondary | ICD-10-CM | POA: Diagnosis not present

## 2021-07-23 DIAGNOSIS — L905 Scar conditions and fibrosis of skin: Secondary | ICD-10-CM | POA: Diagnosis not present

## 2021-07-23 DIAGNOSIS — D485 Neoplasm of uncertain behavior of skin: Secondary | ICD-10-CM | POA: Diagnosis not present

## 2021-10-08 DIAGNOSIS — M545 Low back pain, unspecified: Secondary | ICD-10-CM | POA: Diagnosis not present

## 2021-10-20 DIAGNOSIS — K219 Gastro-esophageal reflux disease without esophagitis: Secondary | ICD-10-CM | POA: Diagnosis not present

## 2021-10-20 DIAGNOSIS — R0989 Other specified symptoms and signs involving the circulatory and respiratory systems: Secondary | ICD-10-CM | POA: Diagnosis not present

## 2021-10-20 DIAGNOSIS — R051 Acute cough: Secondary | ICD-10-CM | POA: Diagnosis not present

## 2021-10-20 DIAGNOSIS — Z6839 Body mass index (BMI) 39.0-39.9, adult: Secondary | ICD-10-CM | POA: Diagnosis not present

## 2021-10-20 DIAGNOSIS — R059 Cough, unspecified: Secondary | ICD-10-CM | POA: Diagnosis not present

## 2021-10-27 DIAGNOSIS — J069 Acute upper respiratory infection, unspecified: Secondary | ICD-10-CM | POA: Diagnosis not present

## 2021-10-27 DIAGNOSIS — R053 Chronic cough: Secondary | ICD-10-CM | POA: Diagnosis not present

## 2021-10-27 DIAGNOSIS — Z23 Encounter for immunization: Secondary | ICD-10-CM | POA: Diagnosis not present

## 2021-10-27 DIAGNOSIS — Z6841 Body Mass Index (BMI) 40.0 and over, adult: Secondary | ICD-10-CM | POA: Diagnosis not present

## 2022-12-17 ENCOUNTER — Other Ambulatory Visit: Payer: Self-pay | Admitting: Family Medicine

## 2022-12-17 DIAGNOSIS — Z1231 Encounter for screening mammogram for malignant neoplasm of breast: Secondary | ICD-10-CM

## 2022-12-18 ENCOUNTER — Ambulatory Visit
Admission: RE | Admit: 2022-12-18 | Discharge: 2022-12-18 | Disposition: A | Discharge status: Home / Self Care | Payer: Managed Care, Other (non HMO) | Source: Ambulatory Visit | Attending: Family Medicine | Admitting: Family Medicine

## 2022-12-18 DIAGNOSIS — Z1231 Encounter for screening mammogram for malignant neoplasm of breast: Secondary | ICD-10-CM

## 2023-06-24 ENCOUNTER — Encounter: Payer: Self-pay | Admitting: Gastroenterology

## 2023-07-27 IMAGING — MG MM DIGITAL SCREENING BILAT W/ TOMO AND CAD
8 series · 8 of 24 positions shown · non-contrast
Comparison: Previous exam(s).

CLINICAL DATA: Screening.

EXAM:
DIGITAL SCREENING BILATERAL MAMMOGRAM WITH TOMOSYNTHESIS AND CAD
TECHNIQUE: Bilateral screening digital craniocaudal and mediolateral oblique
mammograms were obtained. Bilateral screening digital breast
tomosynthesis was performed. The images were evaluated with
computer-aided detection.

[L CC synth-2D]
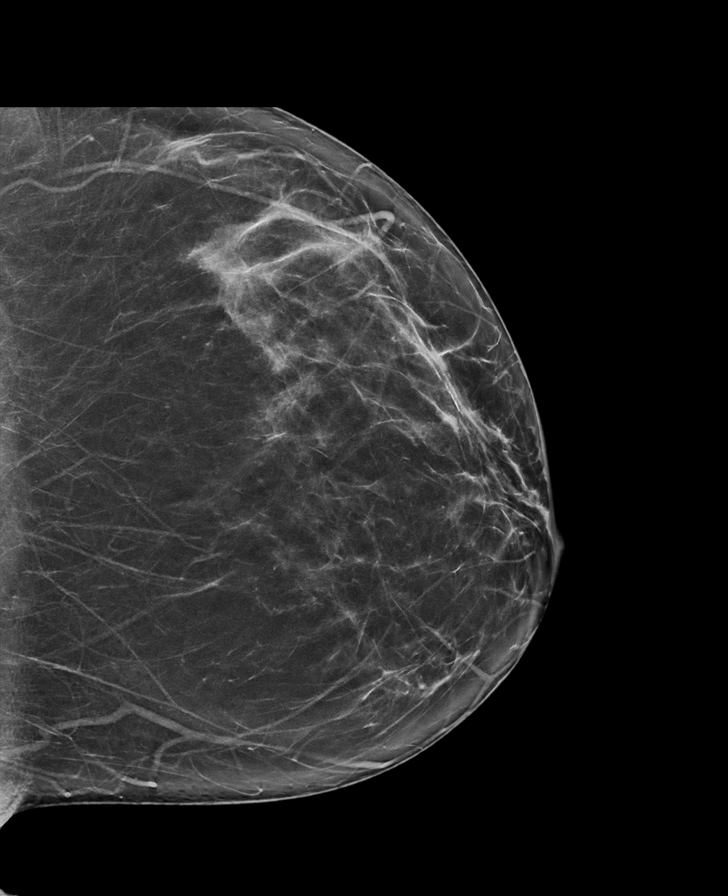

[L MLO synth-2D]
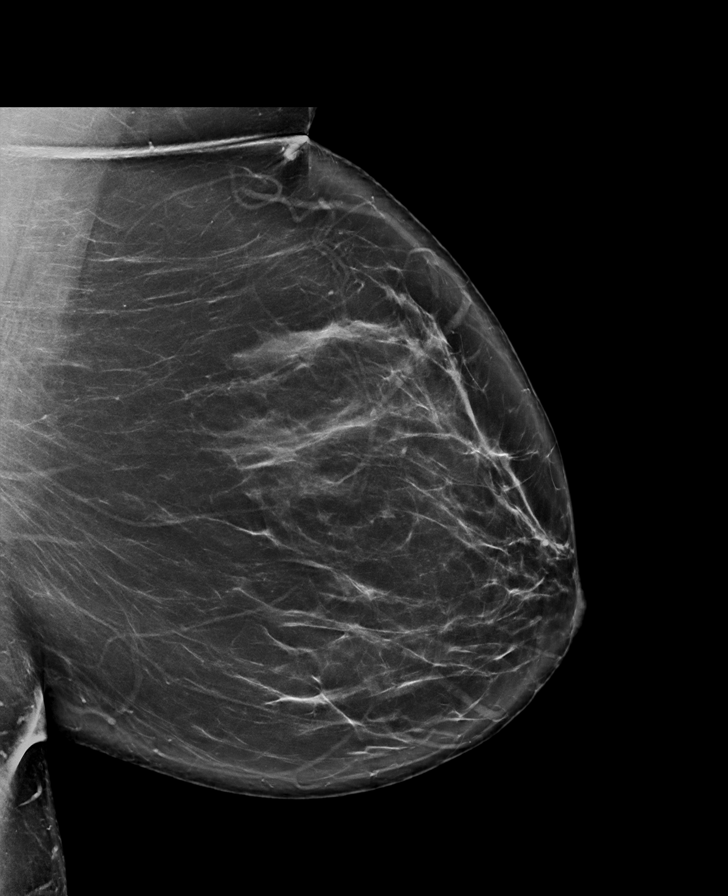

[R MLO synth-2D]
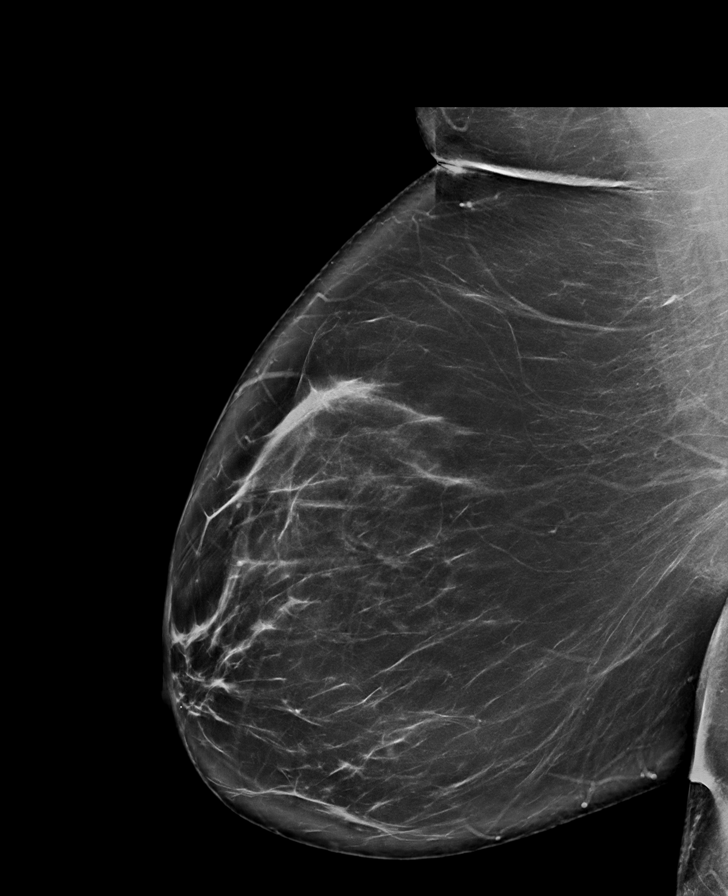

[R CC synth-2D]
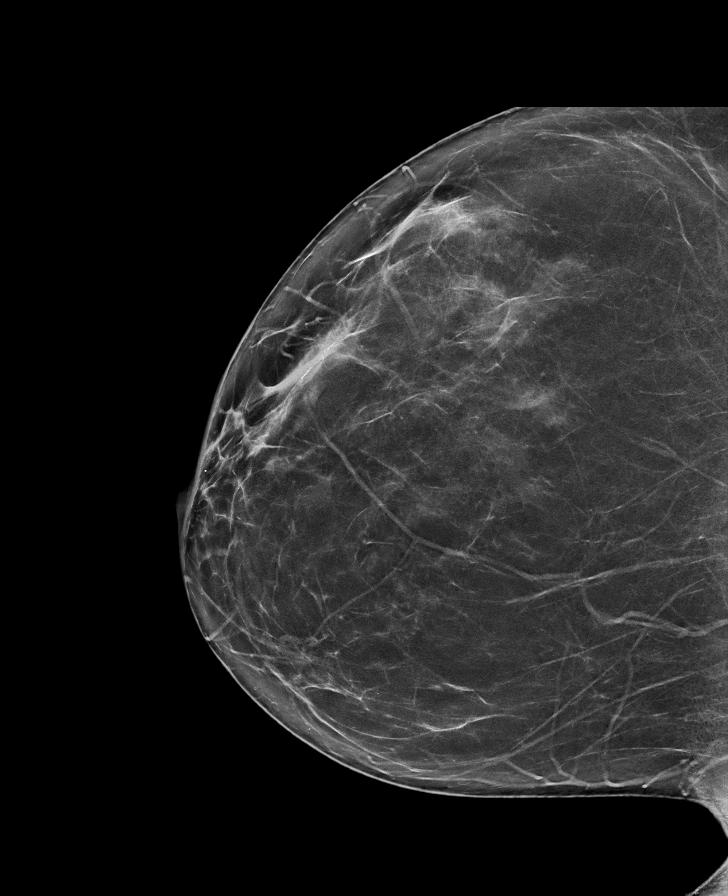

[L MLO tomo · tomo slice 49/97.0]
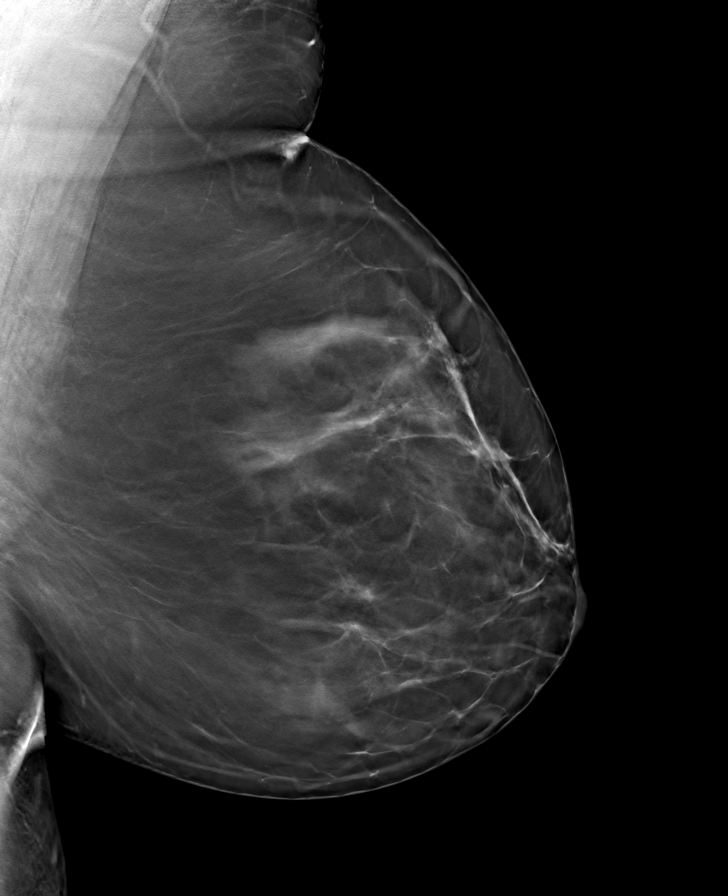

[R MLO tomo · tomo slice 49/98.0]
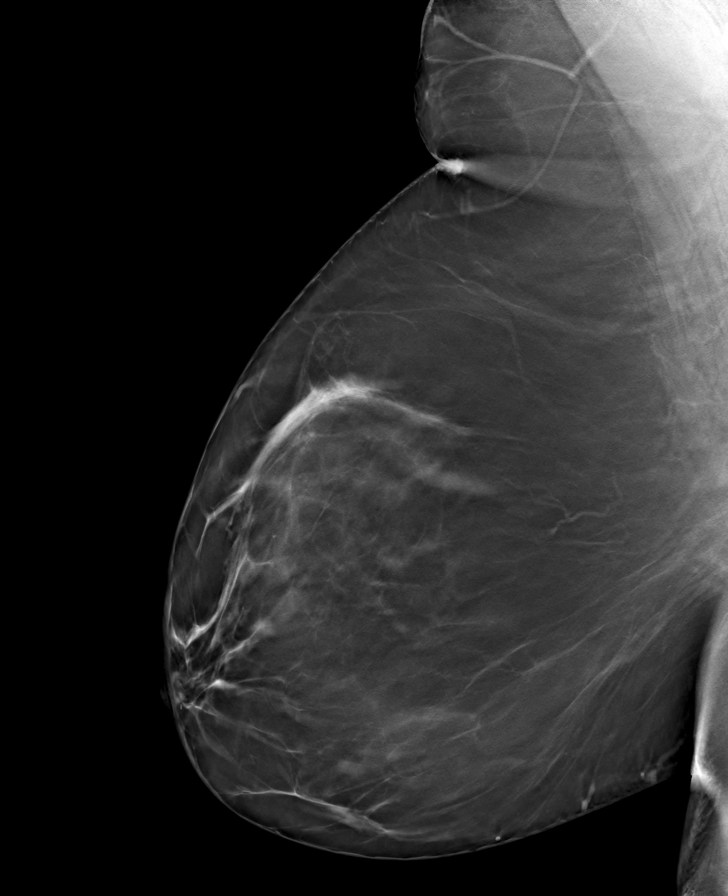

[L CC tomo · tomo slice 41/81.0]
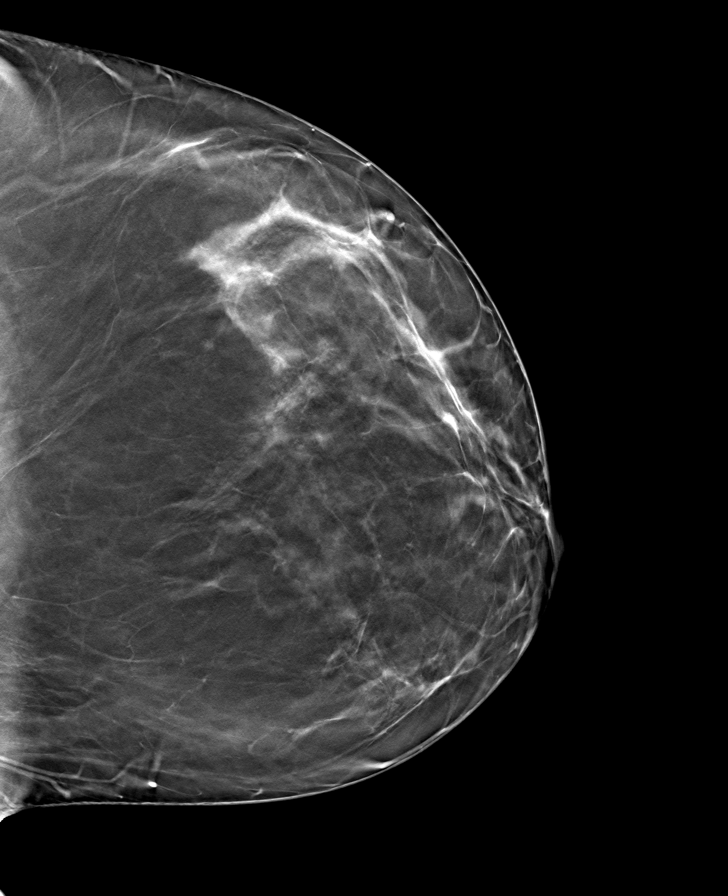

[R CC tomo · tomo slice 40/79.0]
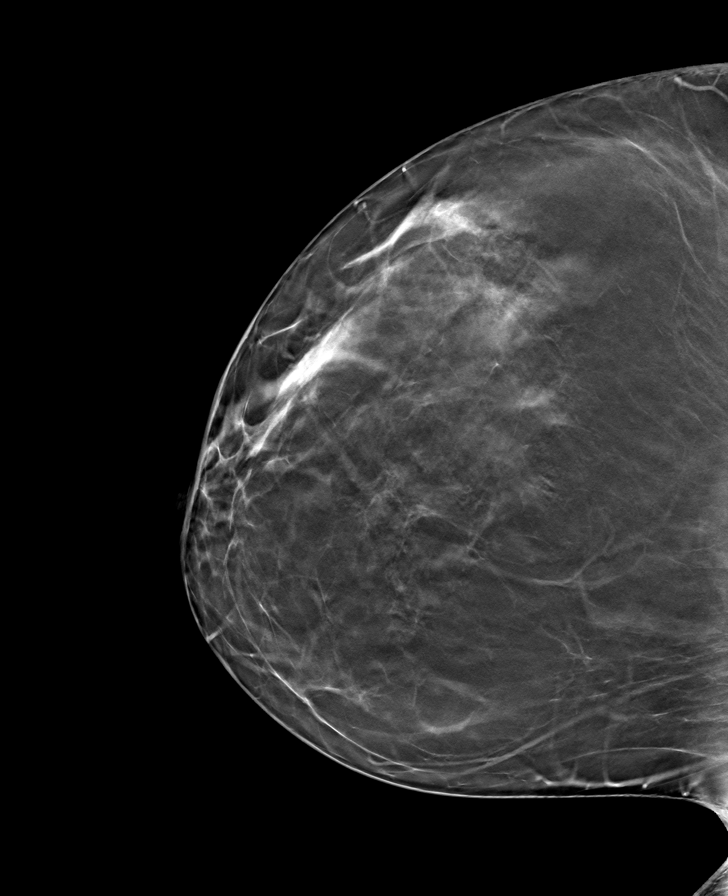

[8 of 24 positions shown; findings below may reference images not displayed]

ACR Breast Density Category b: There are scattered areas of
fibroglandular density.
FINDINGS: There are no findings suspicious for malignancy.
IMPRESSION: No mammographic evidence of malignancy. A result letter of this
screening mammogram will be mailed directly to the patient.

RECOMMENDATION:
Screening mammogram in one year. (Code:51-O-LD2)

BI-RADS CATEGORY  1: Negative.

## 2023-08-04 NOTE — Progress Notes (Deleted)
 Katherine Atkinson [id number] Jul 04, 1980   Chief Complaint: Discuss colonoscopy  Referring Provider: Katina Pfeiffer, PA-C Primary GI MD: Sampson  HPI: Katherine Atkinson is a 43 y.o. female with past medical history of *** who presents today to discuss colonoscopy based on family history.       Previous GI Procedures/Imaging      Past Medical History:  Diagnosis Date   Complication of anesthesia    Had hot and cold sweats after having anesthesia   Constipation due to pain medication     Past Surgical History:  Procedure Laterality Date   BACK SURGERY  October 2016   DILATION AND CURETTAGE OF UTERUS  2000   LUMBAR WOUND DEBRIDEMENT  01/31/2015   Procedure: IRRIGATION AND DEBRIDEMENT OF LUMBAR WOUND;  Surgeon: Katherine GORMAN Molt, MD;  Location: [facility];  Service: Neurosurgery;;   TRANSFORAMINAL LUMBAR INTERBODY FUSION (TLIF) WITH PEDICLE SCREW FIXATION 1 LEVEL N/A 01/31/2015   Procedure: cancelled after start due to possible soft tissue infection;  Surgeon: Katherine GORMAN Molt, MD;  Location: [facility];  Service: Neurosurgery;  Laterality: N/A;   WISDOM TOOTH EXTRACTION  2006    Current Outpatient Medications  Medication Sig Dispense Refill   diazepam  (VALIUM ) 5 MG tablet Take 5 mg by mouth every 6 (six) hours as needed for anxiety.     medroxyPROGESTERone (DEPO-PROVERA) 150 MG/ML injection Inject 150 mg into the muscle every 3 (three) months.     naproxen (NAPROSYN) 500 MG tablet Take 500 mg by mouth 2 (two) times daily with a meal.     naproxen sodium (ANAPROX) 220 MG tablet Take 220 mg by mouth 2 (two) times daily with a meal.     oxyCODONE -acetaminophen  (PERCOCET/ROXICET) 5-325 MG tablet Take 2 tablets by mouth every 4 (four) hours as needed for moderate pain or severe pain. 30 tablet 0   No current facility-administered medications for this visit.    Allergies as of 08/05/2023   (No Known Allergies)    Family History  Problem Relation Age of  Onset   Breast cancer Paternal Grandmother 27 - 57   BRCA 1/2 Neg Hx     Social History   Tobacco Use   Smoking status: Never   Smokeless tobacco: Never  Substance Use Topics   Alcohol use: Yes    Comment: social   Drug use: No     Review of Systems:    Constitutional: No weight loss, fever, chills, weakness or fatigue Eyes: No change in vision Ears, Nose, Throat:  No change in hearing or congestion Skin: No rash or itching Cardiovascular: No chest pain, chest pressure or palpitations   Respiratory: No SOB or cough Gastrointestinal: See HPI and otherwise negative Genitourinary: No dysuria or change in urinary frequency Neurological: No headache, dizziness or syncope Musculoskeletal: No new muscle or joint pain Hematologic: No bleeding or bruising    Physical Exam:  Vital signs: There were no vitals taken for this visit.  Constitutional: NAD, Well developed, Well nourished, alert and cooperative Head:  Normocephalic and atraumatic.  Eyes: No scleral icterus. Conjunctiva pink. Mouth: No oral lesions. Respiratory: Respirations even and unlabored. Lungs clear to auscultation bilaterally.  No wheezes, crackles, or rhonchi.  Cardiovascular:  Regular rate and rhythm. No murmurs. No peripheral edema. Gastrointestinal:  Soft, nondistended, nontender. No rebound or guarding. Normal bowel sounds. No appreciable masses or hepatomegaly. Rectal:  Not performed.  Neurologic:  Alert and oriented x4;  grossly normal neurologically.  Skin:  Dry and intact without significant lesions or rashes. Psychiatric: Oriented to person, place and time. Demonstrates good judgement and reason without abnormal affect or behaviors.   RELEVANT LABS AND IMAGING: CBC    Component Value Date/Time   WBC 9.4 01/25/2015 1107   RBC 4.68 01/25/2015 1107   HGB 14.1 01/25/2015 1107   HCT 41.8 01/25/2015 1107   PLT 334 01/25/2015 1107   MCV 89.3 01/25/2015 1107   MCH 30.1 01/25/2015 1107   MCHC 33.7  01/25/2015 1107   RDW 13.2 01/25/2015 1107    CMP     Component Value Date/Time   NA 142 02/01/2015 0406   K 4.0 02/01/2015 0406   CL 112 (H) 02/01/2015 0406   CO2 20 (L) 02/01/2015 0406   GLUCOSE 145 (H) 02/01/2015 0406   BUN 6 02/01/2015 0406   CREATININE 0.69 02/01/2015 0406   CALCIUM 9.3 02/01/2015 0406   GFRNONAA >60 02/01/2015 0406   GFRAA >60 02/01/2015 0406     Assessment/Plan:       Katherine Furbish, PA-C [facility] 08/04/2023, 11:19 AM  Patient Care Team: Katina Pfeiffer, PA-C as PCP - General (Family Medicine)

## 2023-08-05 ENCOUNTER — Ambulatory Visit: Admitting: Gastroenterology

## 2023-09-27 NOTE — Progress Notes (Unsigned)
 09/28/2023 Katherine Atkinson [id number] [birth year]  Referring provider: Katina Pfeiffer, PA-C Primary GI doctor: Dr. San  ASSESSMENT AND PLAN:  Family history of colon cancer with history of rectal spasms MGM colon cancer age 43's, passed at age 65 of colon cancer First cousin at age 65 TA polyp with moderate dysplasia and AVMs Mother with polyps removed, 2 maternal aunts with polyps removed No changes in bowel habits but would have nocturnal rectal spasms that have improved, no blood in stool Will schedule colonoscopy with family history of colon cancer and rectal spasm, We have discussed the risks of bleeding, infection, perforation, medication reactions, and remote risk of death associated with colonoscopy. All questions were answered and the patient acknowledges these risk and wishes to proceed. - consider pelvic spasm  GERD  with nocturnal acid regurg, resolved at night On omeprazole 10 mg daily, if she misses a dose she will have GERD Rare ETOH, rare NSAIDS, no smoking history - continue omeprazole 10 mg -Lifestyle changes discussed, avoid NSAIDS, ETOH, hand out given to the patient -Weight loss discussed with the patient -Schedule [facility] at [facility] to evaluate GERD, esophagitis, hiatal hernia,H pylori. I discussed risks of EGD with patient today, including risk of sedation, bleeding or perforation. Patient provides understanding and gave verbal consent to proceed.  Obesity  Body mass index is 31.8 kg/m.  -Patient has been advised to make an attempt to improve diet and exercise patterns to aid in weight loss. -Recommended diet heavy in fruits and veggies and low in animal meats, cheeses, and dairy products, appropriate calorie intake   Patient Care Team: Katina Pfeiffer, PA-C as PCP - General (Family Medicine)  HISTORY OF PRESENT ILLNESS: 43 y.o. female with a past medical history listed below presents for evaluation of colonoscopy with family history.    Discussed the use of AI scribe software for clinical note transcription with the patient, who gave verbal consent to proceed.  History of Present Illness   Katherine Atkinson is a 43 year old female who presents for a colonoscopy screening due to family history of colon cancer and polyps.  Her maternal grandmother died of colon cancer at age 17, having been diagnosed in her 62s. Her first cousin, aged 27-39, recently had a colonoscopy where a tubular adenoma with moderate dysplasia was found, prompting her to advise all family members to undergo screening. Her mother and maternal aunts have also had polyps removed, though details on frequency and pathology are unclear.  No current symptoms such as changes in bowel habits, blood in stool, or dark stools. 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 She has a history of constipation post-pregnancy but denies current issues.  She has a history of gastroesophageal reflux disease (GERD), experiencing heartburn and nocturnal acid reflux, which led to vomiting before starting omeprazole. She currently takes omeprazole, a burgundy and white capsule, daily and notices significant heartburn if a dose is missed. She has reduced alcohol intake and occasionally uses ibuprofen.  Her social history includes no smoking and a significant reduction in alcohol consumption. She has a history of pelvic floor issues post-pregnancy, having attended pelvic floor physical therapy once.      She  reports that she has never smoked. She has never used smokeless tobacco. She reports current alcohol use. She reports current drug use. Drug: Marijuana.  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  CMP     Component Value Date/Time   NA 142 02/01/2015 0406   K 4.0 02/01/2015 0406   CL 112 (H) 02/01/2015 0406   CO2 20 (L) 02/01/2015 0406   GLUCOSE 145 (H) 02/01/2015 0406   BUN 6 02/01/2015 0406   CREATININE 0.69 02/01/2015 0406   CALCIUM 9.3 02/01/2015 0406   GFRNONAA >60 02/01/2015 0406   GFRAA >60 02/01/2015 0406       No data to display            Current Medications:    Current Outpatient Medications (Cardiovascular):    rosuvastatin (CRESTOR) 10 MG tablet, Take 10 mg by mouth daily.     Current Outpatient Medications (Other):    buPROPion (WELLBUTRIN XL) 150 MG 24 hr tablet, Take 150 mg by mouth daily.   citalopram (CELEXA) 20 MG tablet, Take 20 mg by mouth daily.   Na Sulfate-K Sulfate-Mg Sulfate concentrate (SUPREP) 17.5-3.13-1.6 GM/177ML SOLN, Take 1 kit (354 mLs total) by mouth once for 1 dose.   omeprazole (PRILOSEC) 10 MG capsule, Take 10 mg by mouth daily.  Medical History:  Past Medical History:  Diagnosis Date   ADD (attention deficit disorder)    Anxiety    Constipation due to pain medication    GERD (gastroesophageal reflux disease)    Hypercholesteremia    Allergies: No Known Allergies   Surgical History:  She  has a past surgical history that includes Back surgery (October 2016); Dilation and curettage of uterus (2000); Wisdom tooth extraction (2006); Transforaminal lumbar interbody fusion (tlif) with pedicle screw fixation 1 level (N/A, 01/31/2015); and Lumbar wound debridement (01/31/2015). Family History:  Her family history includes Breast cancer (age of onset: 42 - 23) in her paternal grandmother; Colon cancer in  her maternal grandmother; Colon polyps in her mother.  REVIEW OF SYSTEMS  : All other systems reviewed and negative except where noted in the History of Present Illness.  PHYSICAL EXAM: BP (!) 132/90 (BP Location: Left Arm, Patient Position: Sitting, Cuff Size: Large)   Pulse 80   Ht 6' (1.829 m)   Wt 234 lb 8 oz (106.4 kg)   BMI 31.80 kg/m  Physical Exam   GENERAL APPEARANCE: Well nourished, in no apparent distress HEENT: No cervical lymphadenopathy, unremarkable thyroid, sclerae anicteric, conjunctiva pink RESPIRATORY: Respiratory effort normal, BS equal bilateral without rales, rhonchi, wheezing CARDIO: RRR with no MRGs, peripheral pulses intact ABDOMEN: Soft, non distended, active bowel sounds in all 4 quadrants, no tenderness to palpation, no rebound, no mass appreciated RECTAL: declines MUSCULOSKELETAL: Full ROM, normal gait, without edema SKIN: Dry, intact without rashes or lesions. No jaundice. NEURO: Alert, oriented, no focal deficits PSYCH: Cooperative, normal mood and affect.      Alan JONELLE Coombs, PA-C 10:49 AM

## 2023-09-28 ENCOUNTER — Encounter: Payer: Self-pay | Admitting: Physician Assistant

## 2023-09-28 ENCOUNTER — Ambulatory Visit: Admitting: Physician Assistant

## 2023-09-28 VITALS — BP 132/90 | HR 80 | Ht 72.0 in | Wt 234.5 lb

## 2023-09-28 DIAGNOSIS — K219 Gastro-esophageal reflux disease without esophagitis: Secondary | ICD-10-CM

## 2023-09-28 DIAGNOSIS — Z6831 Body mass index (BMI) 31.0-31.9, adult: Secondary | ICD-10-CM | POA: Diagnosis not present

## 2023-09-28 DIAGNOSIS — Z800 Family history of malignant neoplasm of digestive organs: Secondary | ICD-10-CM

## 2023-09-28 DIAGNOSIS — K594 Anal spasm: Secondary | ICD-10-CM | POA: Diagnosis not present

## 2023-09-28 MED ORDER — NA SULFATE-K SULFATE-MG SULF 17.5-3.13-1.6 GM/177ML PO SOLN: 1.0000 | Freq: Once | ORAL | 0 refills | Status: AC

## 2023-09-28 NOTE — Patient Instructions (Addendum)
 You have been scheduled for an endoscopy and colonoscopy. Please follow the written instructions given to you at your visit today.  If you use inhalers (even only as needed), please bring them with you on the day of your procedure.  DO NOT TAKE 7 DAYS PRIOR TO TEST- Trulicity (dulaglutide) Ozempic, Wegovy (semaglutide) Mounjaro (tirzepatide) Bydureon Bcise (exanatide extended release)  DO NOT TAKE 1 DAY PRIOR TO YOUR TEST Rybelsus (semaglutide) Adlyxin (lixisenatide) Victoza (liraglutide) Byetta (exanatide) ___________________________________________________________________________   Please take your proton pump inhibitor medication, omeprazole  Please take this medication 30 minutes to 1 hour before meals- this makes it more effective.  Avoid spicy and acidic foods Avoid fatty foods Limit your intake of coffee, tea, alcohol, and carbonated drinks Work to maintain a healthy weight Keep the head of the bed elevated at least 3 inches with blocks or a wedge pillow if you are having any nighttime symptoms Stay upright for 2 hours after eating Avoid meals and snacks three to four hours before bedtime   Here some information about pelvic floor dysfunction. This may be contributing to some of your symptoms. We will continue with our evaluation but I do want you to consider adding on fiber supplement with low-dose MiraLAX daily. We could also refer to pelvic floor physical therapy.   Pelvic Floor Dysfunction, Female Pelvic floor dysfunction (PFD) is a condition that results when the group of muscles and connective tissues that support the organs in the pelvis (pelvic floor muscles) do not work well. These muscles and their connections form a sling that supports the colon and bladder. In women, they also support the uterus. PFD causes pelvic floor muscles to be too weak, too tight, or both. In PFD, muscle movements are not coordinated. This may cause bowel or bladder problems. It may also  cause pain. What are the causes? This condition may be caused by an injury to the pelvic area or by a weakening of pelvic muscles. This often results from pregnancy and childbirth or other types of strain. In many cases, the exact cause is not known. What increases the risk? The following factors may make you more likely to develop this condition: Having chronic bladder tissue inflammation (interstitial cystitis). Being an older person. Being overweight. History of radiation treatment for cancer in the pelvic region. Previous pelvic surgery, such as removal of the uterus (hysterectomy). What are the signs or symptoms? Symptoms of this condition vary and may include: Bladder symptoms, such as: Trouble starting urination and emptying the bladder. Frequent urinary tract infections. Leaking urine when coughing, laughing, or exercising (stress incontinence). Having to pass urine urgently or frequently. Pain when passing urine. Bowel symptoms, such as: Constipation. Urgent or frequent bowel movements. Incomplete bowel movements. Painful bowel movements. Leaking stool or gas. Unexplained genital or rectal pain. Genital or rectal muscle spasms. Low back pain. Other symptoms may include: A heavy, full, or aching feeling in the vagina. A bulge that protrudes into the vagina. Pain during or after sex. How is this diagnosed? This condition may be diagnosed based on: Your symptoms and medical history. A physical exam. During the exam, your health care provider may check your pelvic muscles for tightness, spasm, pain, or weakness. This may include a rectal exam and a pelvic exam. In some cases, you may have diagnostic tests, such as: Electrical muscle function tests. Urine flow testing. X-ray tests of bowel function. Ultrasound of the pelvic organs. How is this treated? Treatment for this condition depends on the symptoms. Treatment  options include: Physical therapy. This may include  Kegel exercises to help relax or strengthen the pelvic floor muscles. Biofeedback. This type of therapy provides feedback on how tight your pelvic floor muscles are so that you can learn to control them. Internal or external massage therapy. A treatment that involves electrical stimulation of the pelvic floor muscles to help control pain (transcutaneous electrical nerve stimulation, or TENS). Sound wave therapy (ultrasound) to reduce muscle spasms. Medicines, such as: Muscle relaxants. Bladder control medicines. Surgery to reconstruct or support pelvic floor muscles may be an option if other treatments do not help. Follow these instructions at home: Activity Do your usual activities as told by your health care provider. Ask your health care provider if you should modify any activities. Do pelvic floor strengthening or relaxing exercises at home as told by your physical therapist. Lifestyle Maintain a healthy weight. Eat foods that are high in fiber, such as beans, whole grains, and fresh fruits and vegetables. Limit foods that are high in fat and processed sugars, such as fried or sweet foods. Manage stress with relaxation techniques such as yoga or meditation. General instructions If you have problems with leakage: Use absorbable pads or wear padded underwear. Wash frequently with mild soap. Keep your genital and anal area as clean and dry as possible. Ask your health care provider if you should try a barrier cream to prevent skin irritation. Take warm baths to relieve pelvic muscle tension or spasms. Take over-the-counter and prescription medicines only as told by your health care provider. Keep all follow-up visits. How is this prevented? The cause of PFD is not always known, but there are a few things you can do to reduce the risk of developing this condition, including: Staying at a healthy weight. Getting regular exercise. Managing stress. Contact a health care provider  if: Your symptoms are not improving with home care. You have signs or symptoms of PFD that get worse at home. You develop new signs or symptoms. You have signs of a urinary tract infection, such as: Fever. Chills. Increased urinary frequency. A burning feeling when urinating. You have not had a bowel movement in 3 days (constipation). Summary Pelvic floor dysfunction results when the muscles and connective tissues in your pelvic floor do not work well. These muscles and their connections form a sling that supports your colon and bladder. In women, they also support the uterus. PFD may be caused by an injury to the pelvic area or by a weakening of pelvic muscles. PFD causes pelvic floor muscles to be too weak, too tight, or a combination of both. Symptoms may vary from person to person. In most cases, PFD can be treated with physical therapies and medicines. Surgery may be an option if other treatments do not help. This information is not intended to replace advice given to you by your health care provider. Make sure you discuss any questions you have with your health care provider. Document Revised: 06/05/2020 Document Reviewed: 06/05/2020 Elsevier Patient Education  2022 [employer].

## 2023-09-30 NOTE — Progress Notes (Signed)
 Agree with the assessment and plan as outlined by Quentin Mulling, PA-C. ? ?Keron Neenan, DO, FACG ? ?

## 2023-10-19 ENCOUNTER — Telehealth: Payer: Self-pay | Admitting: Physician Assistant

## 2023-10-19 NOTE — Telephone Encounter (Signed)
 Inbound call from pt stating that she is needing for our pre-cert department to get in touch with her to inform her if her endo colon will be covered by her insurance. Patient also stated that he brother has had a recently colonoscopy and turn out he had a pre cancerous polyp. Patient is requesting a call back. Patient is schedule for her Endo Colon on September the 23 rd. Please advise.

## 2023-10-22 NOTE — Telephone Encounter (Signed)
Patient returning call, Please advise.  Thank you  

## 2023-10-25 ENCOUNTER — Telehealth: Payer: Self-pay | Admitting: Gastroenterology

## 2023-10-25 NOTE — Telephone Encounter (Signed)
 Pt would like to know if her colonoscopy can be coded as surveillance or high risk since she has had a history of cancer polyps in her family and in order for insurance to possibly cover the procedure,

## 2023-11-01 ENCOUNTER — Telehealth: Payer: Self-pay

## 2023-11-01 ENCOUNTER — Telehealth: Payer: Self-pay | Admitting: Gastroenterology

## 2023-11-01 NOTE — Telephone Encounter (Signed)
 Received a call from patient requesting nurse fu call to discuss in depth, procedure ENDOCOLON tomorrow, patient started period and is nervous. Please review and advise   Thank you

## 2023-11-01 NOTE — Telephone Encounter (Signed)
 RN returned call to patient who was concerned about having unexpectedly started her period today; patient was not sure if this would be problematic for her colonoscopy on 9/23. RN assured patient that she is able to have the procedure done, that we provide bed pads for all patients d/t knowing patients have various needs like hers for which bed pads are helpful. Patient asked about sanitary items; RN assured patient that these are good to have for the procedure. Patient stated understanding and was appreciative of the return call.

## 2023-11-02 ENCOUNTER — Encounter: Payer: Self-pay | Admitting: Gastroenterology

## 2023-11-02 ENCOUNTER — Ambulatory Visit (AMBULATORY_SURGERY_CENTER): Admitting: Gastroenterology

## 2023-11-02 VITALS — BP 164/87 | HR 71 | Temp 98.3°F | Resp 15 | Ht 72.0 in | Wt 234.0 lb

## 2023-11-02 DIAGNOSIS — Z1211 Encounter for screening for malignant neoplasm of colon: Secondary | ICD-10-CM | POA: Diagnosis present

## 2023-11-02 DIAGNOSIS — Z800 Family history of malignant neoplasm of digestive organs: Secondary | ICD-10-CM

## 2023-11-02 DIAGNOSIS — K219 Gastro-esophageal reflux disease without esophagitis: Secondary | ICD-10-CM

## 2023-11-02 DIAGNOSIS — Z83719 Family history of colon polyps, unspecified: Secondary | ICD-10-CM | POA: Diagnosis not present

## 2023-11-02 DIAGNOSIS — K648 Other hemorrhoids: Secondary | ICD-10-CM

## 2023-11-02 DIAGNOSIS — K449 Diaphragmatic hernia without obstruction or gangrene: Secondary | ICD-10-CM

## 2023-11-02 DIAGNOSIS — K641 Second degree hemorrhoids: Secondary | ICD-10-CM

## 2023-11-02 DIAGNOSIS — K562 Volvulus: Secondary | ICD-10-CM | POA: Diagnosis not present

## 2023-11-02 MED ORDER — SODIUM CHLORIDE 0.9 % IV SOLN: 500.0000 mL | INTRAVENOUS | Status: DC

## 2023-11-02 NOTE — Progress Notes (Signed)
 GASTROENTEROLOGY PROCEDURE H&P NOTE   Primary Care Physician: Katina Pfeiffer, PA-C    Reason for Procedure:  Family history of colon cancer, family history of colon polyps, rectal spasm, GERD  Plan:    EGD, colonoscopy  Patient is appropriate for endoscopic procedure(s) in the ambulatory ([facility]) setting.  The nature of the procedure, as well as the risks, benefits, and alternatives were carefully and thoroughly reviewed with the patient. Ample time for discussion and questions allowed. The patient understood, was satisfied, and agreed to proceed.     HPI: Katherine Atkinson is a 43 y.o. female who presents for EGD for evaluation of reflux symptoms, to include heartburn and nocturnal regurgitation.  Currently treated with omeprazole 10 mg daily with breakthrough reflux symptoms with any missed dose.  Additionally, presents for colonoscopy due to family history of colon cancer and colon polyps. Her maternal grandmother died of colon cancer at age 21, having been diagnosed in her 57s. Her first cousin, aged 52-39, recently had a colonoscopy where a tubular adenoma with moderate dysplasia was found, prompting her to advise all family members to undergo screening. Her mother and maternal aunts have also had polyps removed, though details on frequency and pathology are unclear.   Past Medical History:  Diagnosis Date   ADD (attention deficit disorder)    Anxiety    Constipation due to pain medication    GERD (gastroesophageal reflux disease)    Hypercholesteremia     Past Surgical History:  Procedure Laterality Date   BACK SURGERY  October 2016   DILATION AND CURETTAGE OF UTERUS  2000   LUMBAR WOUND DEBRIDEMENT  01/31/2015   Procedure: IRRIGATION AND DEBRIDEMENT OF LUMBAR WOUND;  Surgeon: Alm GORMAN Molt, MD;  Location: [facility];  Service: Neurosurgery;;   TRANSFORAMINAL LUMBAR INTERBODY FUSION (TLIF) WITH PEDICLE SCREW FIXATION 1 LEVEL N/A 01/31/2015   Procedure:  cancelled after start due to possible soft tissue infection;  Surgeon: Alm GORMAN Molt, MD;  Location: [facility];  Service: Neurosurgery;  Laterality: N/A;   WISDOM TOOTH EXTRACTION  2006    Prior to Admission medications   Medication Sig Start Date End Date Taking? Authorizing Provider  buPROPion (WELLBUTRIN XL) 150 MG 24 hr tablet Take 150 mg by mouth daily.    [provider]  citalopram (CELEXA) 20 MG tablet Take 20 mg by mouth daily.    [provider]  omeprazole (PRILOSEC) 10 MG capsule Take 10 mg by mouth daily.    [provider]  rosuvastatin (CRESTOR) 10 MG tablet Take 10 mg by mouth daily.    [provider]    Current Outpatient Medications  Medication Sig Dispense Refill   buPROPion (WELLBUTRIN XL) 150 MG 24 hr tablet Take 150 mg by mouth daily.     citalopram (CELEXA) 20 MG tablet Take 20 mg by mouth daily.     omeprazole (PRILOSEC) 10 MG capsule Take 10 mg by mouth daily.     rosuvastatin (CRESTOR) 10 MG tablet Take 10 mg by mouth daily.     No current facility-administered medications for this visit.    Allergies as of 11/02/2023   (No Known Allergies)    Family History  Problem Relation Age of Onset   Colon polyps Mother    Colon cancer Maternal Grandmother    Breast cancer Paternal Grandmother 26 - 90   BRCA 1/2 Neg Hx     Social History   Socioeconomic History   Marital status: Married  Spouse name: Not on file   Number of children: 2   Years of education: Not on file   Highest education level: Not on file  Occupational History   Not on file  Tobacco Use   Smoking status: Never   Smokeless tobacco: Never  Vaping Use   Vaping status: Never Used  Substance and Sexual Activity   Alcohol use: Yes    Comment: social   Drug use: Yes    Types: Marijuana    Comment: Delta 9 Gummies   Sexual activity: Not on file  Other Topics Concern   Not on file  Social History Narrative   Not on file   Social Drivers of  Health   Financial Resource Strain: Not on file  Food Insecurity: Not on file  Transportation Needs: Not on file  Physical Activity: Not on file  Stress: Not on file  Social Connections: Not on file  Intimate Partner Violence: Not on file    Physical Exam: Vital signs in last 24 hours: @There  were no vitals taken for this visit. GEN: NAD EYE: Sclerae anicteric ENT: MMM CV: Non-tachycardic Pulm: CTA b/l GI: Soft, NT/ND NEURO:  Alert & Oriented x 3   Sandor Flatter, DO [facility]   11/02/2023 12:48 PM

## 2023-11-02 NOTE — Op Note (Signed)
 [facility] Patient Name: Katherine Atkinson Procedure Date: 11/02/2023 1:49 PM MRN: [id number] Endoscopist: Sandor Flatter , MD, [id number] Age: 43 Referring MD:  Date of Birth: April 11, 1980 Gender: Female Account #: [id number] Procedure:                Colonoscopy Indications:              Screening for colon cancer: Family history of                            colorectal cancer in distant relative(s) before age                            28, Colon cancer screening in patient at increased                            risk: Family history of 1st-degree relative with                            colon polyps before age 33 years, Colon cancer                            screening in patient at increased risk: Family                            history of colon polyps in multiple 1st-degree                            relatives Medicines:                Monitored Anesthesia Care Procedure:                Pre-Anesthesia Assessment:                           - Prior to the procedure, a History and Physical                            was performed, and patient medications and                            allergies were reviewed. The patient's tolerance of                            previous anesthesia was also reviewed. The risks                            and benefits of the procedure and the sedation                            options and risks were discussed with the patient.                            All questions were answered, and informed consent  was obtained. Prior Anticoagulants: The patient has                            taken no anticoagulant or antiplatelet agents. ASA                            Grade Assessment: II - A patient with mild systemic                            disease. After reviewing the risks and benefits,                            the patient was deemed in satisfactory condition to                            undergo the procedure.                            After obtaining informed consent, the colonoscope                            was passed under direct vision. Throughout the                            procedure, the patient's blood pressure, pulse, and                            oxygen saturations were monitored continuously. The                            Olympus [id number] ([id number]) Colonoscope was                            introduced through the anus and advanced to the the                            cecum, identified by appendiceal orifice and                            ileocecal valve. The colonoscopy was technically                            difficult and complex due to significant looping.                            Successful completion of the procedure was aided by                            straightening and shortening the scope to obtain                            bowel loop reduction and applying abdominal  pressure. The patient tolerated the procedure well.                            The quality of the bowel preparation was good. The                            ileocecal valve, appendiceal orifice, and rectum                            were photographed. Scope In: 2:09:54 PM Scope Out: 2:29:51 PM Scope Withdrawal Time: 0 hours 13 minutes 45 seconds  Total Procedure Duration: 0 hours 19 minutes 57 seconds  Findings:                 The perianal and digital rectal examinations were                            normal.                           The ascending colon revealed significantly                            excessive looping. Advancing the scope required                            using manual pressure and straightening and                            shortening the scope to obtain bowel loop reduction.                           The exam was otherwise normal throughout the                            remainder of the colon.                           Non-bleeding internal  hemorrhoids were found during                            retroflexion. The hemorrhoids were small. Complications:            No immediate complications. Estimated Blood Loss:     Estimated blood loss: none. Impression:               - There was significant looping of the colon.                           - Non-bleeding internal hemorrhoids.                           - No specimens collected. Recommendation:           - Patient has a contact number available for  emergencies. The signs and symptoms of potential                            delayed complications were discussed with the                            patient. Return to normal activities tomorrow.                            Written discharge instructions were provided to the                            patient.                           - Resume previous diet.                           - Continue present medications.                           - Repeat colonoscopy in 10 years for screening                            purposes.                           - Return to GI clinic PRN. Sandor Flatter, MD 11/02/2023 2:43:42 PM

## 2023-11-02 NOTE — Progress Notes (Signed)
 Report to PACU, RN, vss, BBS= Clear.

## 2023-11-02 NOTE — Progress Notes (Signed)
 Pt's states no medical or surgical changes since previsit or office visit.

## 2023-11-02 NOTE — Op Note (Signed)
 [facility] Patient Name: Katherine Atkinson Procedure Date: 11/02/2023 1:58 PM MRN: [id number] Endoscopist: Sandor Flatter , MD, [id number] Age: 43 Referring MD:  Date of Birth: 05-May-1980 Gender: Female Account #: [id number] Procedure:                Upper GI endoscopy Indications:              Heartburn, Esophageal reflux Medicines:                Monitored Anesthesia Care Procedure:                Pre-Anesthesia Assessment:                           - Prior to the procedure, a History and Physical                            was performed, and patient medications and                            allergies were reviewed. The patient's tolerance of                            previous anesthesia was also reviewed. The risks                            and benefits of the procedure and the sedation                            options and risks were discussed with the patient.                            All questions were answered, and informed consent                            was obtained. Prior Anticoagulants: The patient has                            taken no anticoagulant or antiplatelet agents. ASA                            Grade Assessment: II - A patient with mild systemic                            disease. After reviewing the risks and benefits,                            the patient was deemed in satisfactory condition to                            undergo the procedure.                           After obtaining informed consent, the endoscope was  passed under direct vision. Throughout the                            procedure, the patient's blood pressure, pulse, and                            oxygen saturations were monitored continuously. The                            Olympus Scope SN [id number] was introduced through the                            mouth, and advanced to the third part of duodenum.                            The upper GI  endoscopy was accomplished without                            difficulty. The patient tolerated the procedure                            well. Scope In: Scope Out: Findings:                 A 4 cm hiatal hernia was present.                           The Z-line was regular and was found 32 cm from the                            incisors.                           The entire examined stomach was normal.                           The examined duodenum was normal. Complications:            No immediate complications. Estimated Blood Loss:     Estimated blood loss: none. Impression:               - 4 cm hiatal hernia.                           - Z-line regular, 32 cm from the incisors.                           - Normal stomach.                           - Normal examined duodenum.                           - No specimens collected. Recommendation:           - Patient has a contact number available for  emergencies. The signs and symptoms of potential                            delayed complications were discussed with the                            patient. Return to normal activities tomorrow.                            Written discharge instructions were provided to the                            patient.                           - Resume previous diet.                           - Continue present medications.                           - Return to GI clinic PRN. Sandor Flatter, MD 11/02/2023 2:39:47 PM

## 2023-11-02 NOTE — Patient Instructions (Addendum)
 Thank you for letting us  care for your healthcare needs today! Please see handouts regarding Hiatal Hernia and Hemorrhoids. Resume previous diet and medications. Repeat colonoscopy in 10 years.   YOU HAD AN ENDOSCOPIC PROCEDURE TODAY AT THE [facility]:   Refer to the procedure report that was given to you for any specific questions about what was found during the examination.  If the procedure report does not answer your questions, please call your gastroenterologist to clarify.  If you requested that your care partner not be given the details of your procedure findings, then the procedure report has been included in a sealed envelope for you to review at your convenience later.  YOU SHOULD EXPECT: Some feelings of bloating in the abdomen. Passage of more gas than usual.  Walking can help get rid of the air that was put into your GI tract during the procedure and reduce the bloating. If you had a lower endoscopy (such as a colonoscopy or flexible sigmoidoscopy) you may notice spotting of blood in your stool or on the toilet paper. If you underwent a bowel prep for your procedure, you may not have a normal bowel movement for a few days.  Please Note:  You might notice some irritation and congestion in your nose or some drainage.  This is from the oxygen used during your procedure.  There is no need for concern and it should clear up in a day or so.  SYMPTOMS TO REPORT IMMEDIATELY:  Following lower endoscopy (colonoscopy or flexible sigmoidoscopy):  Excessive amounts of blood in the stool  Significant tenderness or worsening of abdominal pains  Swelling of the abdomen that is new, acute  Fever of 100F or higher  Following upper endoscopy (EGD)  Vomiting of blood or coffee ground material  New chest pain or pain under the shoulder blades  Painful or persistently difficult swallowing  New shortness of breath  Fever of 100F or higher  Black, tarry-looking stools  For urgent or  emergent issues, a gastroenterologist can be reached at any hour by calling [phone]. Do not use MyChart messaging for urgent concerns.    DIET:  We do recommend a small meal at first, but then you may proceed to your regular diet.  Drink plenty of fluids but you should avoid alcoholic beverages for 24 hours.  ACTIVITY:  You should plan to take it easy for the rest of today and you should NOT DRIVE or use heavy machinery until tomorrow (because of the sedation medicines used during the test).    FOLLOW UP: Our staff will call the number listed on your records the next business day following your procedure.  We will call around 7:15- 8:00 am to check on you and address any questions or concerns that you may have regarding the information given to you following your procedure. If we do not reach you, we will leave a message.     If any biopsies were taken you will be contacted by phone or by letter within the next 1-3 weeks.  Please call us  at [phone] if you have not heard about the biopsies in 3 weeks.    SIGNATURES/CONFIDENTIALITY: You and/or your care partner have signed paperwork which will be entered into your electronic medical record.  These signatures attest to the fact that that the information above on your After Visit Summary has been reviewed and is understood.  Full responsibility of the confidentiality of this discharge information lies with you and/or your care-partner.

## 2023-11-03 ENCOUNTER — Telehealth: Payer: Self-pay | Admitting: *Deleted

## 2023-11-03 NOTE — Telephone Encounter (Signed)
 Attempted f/u phone call. No answer. Left message.
